# Patient Record
Sex: Male | Born: 1940 | Race: White | Marital: Married | Smoking: Never smoker
Health system: Southern US, Community
[De-identification: ages and names within clinical notes are randomized; demographics above are authoritative.]

## PROBLEM LIST (undated history)

## (undated) DIAGNOSIS — K4090 Unilateral inguinal hernia, without obstruction or gangrene, not specified as recurrent: Secondary | ICD-10-CM

## (undated) DIAGNOSIS — E7800 Pure hypercholesterolemia, unspecified: Secondary | ICD-10-CM

## (undated) DIAGNOSIS — I10 Essential (primary) hypertension: Secondary | ICD-10-CM

## (undated) DIAGNOSIS — D4959 Neoplasm of unspecified behavior of other genitourinary organ: Secondary | ICD-10-CM

## (undated) DIAGNOSIS — I639 Cerebral infarction, unspecified: Secondary | ICD-10-CM

## (undated) HISTORY — DX: Essential (primary) hypertension: I10

## (undated) HISTORY — DX: Pure hypercholesterolemia, unspecified: E78.00

---
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
## 2011-10-10 ENCOUNTER — Other Ambulatory Visit: Payer: Self-pay | Admitting: Urology

## 2011-10-11 ENCOUNTER — Encounter (HOSPITAL_BASED_OUTPATIENT_CLINIC_OR_DEPARTMENT_OTHER): Payer: Self-pay | Admitting: *Deleted

## 2011-10-11 NOTE — H&P (Signed)
History of Present Illness               F/u microscopic hematuria referred by Dr. Judithann Sauger May 2010. He noticed some blood spots on his shorts twice in April 2010 and on his routine physical and microscopic hematuria. He had a cystic area the lower pole of his right kidney that was stable on renal US in 2010 and 2011. Cytology was negative May 2010. He did not have cystoscopy. He had microscopic hematuria saw Dr. Patsi Sears in the early 90s had cystoscopy and x-ray at [facility] radiology which were both "negative".  He is a nonsmoker. His father smoked. He has had no chemo or XRT. No dye solvent exposure.   Renal US Jun 2013 - comparison [place] 2010 and 2011, see technician sheet for details, I reviewed all the images, findings: The right kidney contained a 1.46 cm upper pole central cyst and a right lower pole 1.5 cm exophytic cyst both of these are stable. There was no other mass or hydronephrosis. Left kidney appeared normal without mass or hydronephrosis. The bladder appeared normal no ureters were seen. Post void 50 mL. The spleen was 13 cm and appeared normal.   He's also had a slow stream frequent urination and postvoid dripping that was markedly better with he was put on Flomax. This decreased his nocturia from 3-1 time per night. His PSA was 1.22 July 2008. PSA was 0.98 June 2012. He stopped tamsulosin in 2012. He has had some improvements in his voiding. He attributes this to starting a different work-out routine. He has had no gross hematuria or dysuria. No bothersome LUTS.  May 2013 PSA 1.5.   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 He has one son and 2 granddaughters. He's had no surgery and no other serious medical issues.   He is having a bit more trouble with erections and decreased libido since his wife has gained weight. PSA was 0.98 June 2012.   Interval Hx He returns to review his CT and for cystoscopy to complete his hematuria  evaluation. CT Hematuria June 2013 showed small renal and peripelvic cysts but was otw normal.    Past Medical History Problems  1. History of  Asthma 493.90 2. History of  Hypercholesterolemia 272.0  Current Meds 1. Aspirin 81 MG Oral Tablet; Therapy: (Recorded:04May2010) to 2. Pravastatin Sodium 20 MG Oral Tablet; Therapy: 03May2013 to 3. Vitamin D TABS; Therapy: (Recorded:04May2010) to 4. Zyrtec 10 MG TABS; Therapy: (Recorded:04May2010) to  Allergies Medication  1. No Known Drug Allergies  Family History Problems  1. Family history of  Death In The Family Father stroke 2. Family history of  Death In The Family Mother heart failure  Social History Problems  1. Alcohol Use one glass of wine 3 days out of the week 2. Caffeine Use 3 per day of coffee 3. Marital History - Currently Married 4. Never A Smoker 5. Occupation: retired from [employer] T Denied  6. History of  Tobacco Use  Review of Systems Constitutional, skin, otolaryngeal, cardiovascular and pulmonary system(s) were reviewed and pertinent findings if present are noted.    Physical Exam Constitutional: Well nourished and well developed . No acute distress.  ENT:. The ears and nose are normal in appearance.  Neck: The appearance of the neck is normal and no neck mass is present.  Pulmonary: No respiratory distress and normal respiratory rhythm and effort.  Cardiovascular: Heart rate and rhythm  are normal . No peripheral edema.  Abdomen: The abdomen is soft and nontender. No masses are palpated. No CVA tenderness. No hernias are palpable. No hepatosplenomegaly noted.  Neuro/Psych:. Mood and affect are appropriate.    Results/Data  Urine [Data Includes: Last 1 Day]   19Jun2013  COLOR YELLOW   APPEARANCE CLEAR   SPECIFIC GRAVITY 1.020   pH 6.5   GLUCOSE NEG mg/dL  BILIRUBIN NEG   KETONE NEG mg/dL  BLOOD LARGE   PROTEIN NEG mg/dL  UROBILINOGEN 0.2 mg/dL  NITRITE NEG   LEUKOCYTE ESTERASE NEG   SQUAMOUS  EPITHELIAL/HPF RARE   WBC 0-3 WBC/hpf  RBC 11-20 RBC/hpf  BACTERIA RARE   CRYSTALS NONE SEEN   CASTS NONE SEEN    09 Oct 2011 3:31 PM   UA With REFLEX       COLOR YELLOW       APPEARANCE CLEAR       SPECIFIC GRAVITY 1.020       pH 6.5       GLUCOSE NEG       BILIRUBIN NEG       KETONE NEG       BLOOD LARGE       PROTEIN NEG       UROBILINOGEN 0.2       NITRITE NEG       LEUKOCYTE ESTERASE NEG       SQUAMOUS EPITHELIAL/HPF RARE       WBC 0-3       CRYSTALS NONE SEEN       CASTS NONE SEEN       RBC 11-20       BACTERIA RARE    Procedure  Procedure: Cystoscopy   Indication: Hematuria.  Informed Consent: Risks, benefits, and potential adverse events were discussed and informed consent was obtained from the patient.  Prep: The patient was prepped with betadine.  Antibiotic prophylaxis: Ciprofloxacin.  Procedure Note:  Urethral meatus:. No abnormalities.  Anterior urethra: No abnormalities.  Prostatic urethra: No abnormalities . The lateral prostatic lobes were enlarged.  There is a neoplastic growth hanging off the right lateral lobe flowing back and forth in the prostatic urethra.  Bladder: Visulization was clear. The ureteral orifices were in the normal anatomic position bilaterally and had clear efflux of urine. A systematic survey of the bladder demonstrated no bladder tumors or stones. The mucosa was smooth without abnormalities. The patient tolerated the procedure well.  Complications: None.    Assessment Assessed  1. 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 Follow-up Schedule Surgery Office  Follow-up  Requested for: 19Jun2013  UA With REFLEX  Status: Resulted - Requires Verification  Done: 01Jan0001 12:00AM Ordered Today; For: Health  Maintenance (V70.0); Ordered By: Jerilee Field  Due: 21Jun2013 Marked Important; Last Updated By: Blinda Leatherwood   Discussion/Summary        We discussed the nature, R/B/A of TURP using the TURP handout. I dont think he needs a complete TURP, just removal and biopsy of this neoplastic growth. All questions answered. He elects to proceed.

## 2011-10-14 ENCOUNTER — Encounter (HOSPITAL_BASED_OUTPATIENT_CLINIC_OR_DEPARTMENT_OTHER): Payer: Self-pay | Admitting: *Deleted

## 2011-10-14 NOTE — Progress Notes (Signed)
Pt instructed npo p mn tonight.  To [facility] 6/25 @ 0615.  Needs hgb, ekg on arrival.

## 2011-10-15 ENCOUNTER — Encounter (HOSPITAL_BASED_OUTPATIENT_CLINIC_OR_DEPARTMENT_OTHER): Payer: Self-pay | Admitting: *Deleted

## 2011-10-15 ENCOUNTER — Encounter (HOSPITAL_BASED_OUTPATIENT_CLINIC_OR_DEPARTMENT_OTHER): Payer: Self-pay | Admitting: Anesthesiology

## 2011-10-15 ENCOUNTER — Encounter (HOSPITAL_BASED_OUTPATIENT_CLINIC_OR_DEPARTMENT_OTHER)
Admission: RE | Disposition: A | Discharge status: Home / Self Care | Payer: Self-pay | Source: Ambulatory Visit | Attending: Urology

## 2011-10-15 ENCOUNTER — Ambulatory Visit (HOSPITAL_BASED_OUTPATIENT_CLINIC_OR_DEPARTMENT_OTHER): Payer: Medicare Other | Admitting: Anesthesiology

## 2011-10-15 ENCOUNTER — Ambulatory Visit (HOSPITAL_BASED_OUTPATIENT_CLINIC_OR_DEPARTMENT_OTHER)
Admission: RE | Admit: 2011-10-15 | Discharge: 2011-10-15 | Disposition: A | Discharge status: Home / Self Care | Payer: Medicare Other | Source: Ambulatory Visit | Attending: Urology | Admitting: Urology

## 2011-10-15 ENCOUNTER — Other Ambulatory Visit: Payer: Self-pay

## 2011-10-15 DIAGNOSIS — J45909 Unspecified asthma, uncomplicated: Secondary | ICD-10-CM | POA: Insufficient documentation

## 2011-10-15 DIAGNOSIS — Z7982 Long term (current) use of aspirin: Secondary | ICD-10-CM | POA: Insufficient documentation

## 2011-10-15 DIAGNOSIS — R3129 Other microscopic hematuria: Secondary | ICD-10-CM | POA: Insufficient documentation

## 2011-10-15 DIAGNOSIS — N400 Enlarged prostate without lower urinary tract symptoms: Secondary | ICD-10-CM | POA: Insufficient documentation

## 2011-10-15 DIAGNOSIS — Z79899 Other long term (current) drug therapy: Secondary | ICD-10-CM | POA: Insufficient documentation

## 2011-10-15 DIAGNOSIS — D400 Neoplasm of uncertain behavior of prostate: Secondary | ICD-10-CM | POA: Insufficient documentation

## 2011-10-15 DIAGNOSIS — E7800 Pure hypercholesterolemia, unspecified: Secondary | ICD-10-CM | POA: Insufficient documentation

## 2011-10-15 HISTORY — PX: TRANSURETHRAL RESECTION OF PROSTATE: SHX73

## 2011-10-15 HISTORY — DX: Neoplasm of unspecified behavior of other genitourinary organ: D49.59

## 2011-10-15 LAB — POCT HEMOGLOBIN-HEMACUE: Hemoglobin: 17.8 g/dL — ABNORMAL HIGH (ref 13.0–17.0)

## 2011-10-15 SURGERY — TURP (TRANSURETHRAL RESECTION OF PROSTATE)
Anesthesia: General

## 2011-10-15 MED ORDER — PROMETHAZINE HCL 25 MG/ML IJ SOLN: 6.2500 mg | INTRAMUSCULAR | Status: DC | PRN

## 2011-10-15 MED ORDER — CEFAZOLIN SODIUM 1-5 GM-% IV SOLN
1.0000 g | INTRAVENOUS | Status: AC
  Administered 2011-10-15: 1 g via INTRAVENOUS

## 2011-10-15 MED ORDER — FENTANYL CITRATE 0.05 MG/ML IJ SOLN
INTRAMUSCULAR | Status: DC | PRN
  Administered 2011-10-15: 50 ug via INTRAVENOUS
  Administered 2011-10-15: 25 ug via INTRAVENOUS

## 2011-10-15 MED ORDER — CIPROFLOXACIN HCL 250 MG PO TABS: 500.0000 mg | ORAL_TABLET | Freq: Two times a day (BID) | ORAL | Status: AC

## 2011-10-15 MED ORDER — LIDOCAINE HCL (CARDIAC) 20 MG/ML IV SOLN
INTRAVENOUS | Status: DC | PRN
  Administered 2011-10-15: 75 mg via INTRAVENOUS

## 2011-10-15 MED ORDER — ONDANSETRON HCL 4 MG/2ML IJ SOLN
INTRAMUSCULAR | Status: DC | PRN
  Administered 2011-10-15: 4 mg via INTRAVENOUS

## 2011-10-15 MED ORDER — OXYCODONE-ACETAMINOPHEN 5-325 MG PO TABS: 1.0000 | ORAL_TABLET | Freq: Four times a day (QID) | ORAL | Status: DC | PRN

## 2011-10-15 MED ORDER — DEXAMETHASONE SODIUM PHOSPHATE 4 MG/ML IJ SOLN
INTRAMUSCULAR | Status: DC | PRN
  Administered 2011-10-15: 10 mg via INTRAVENOUS

## 2011-10-15 MED ORDER — MEPERIDINE HCL 25 MG/ML IJ SOLN: 6.2500 mg | INTRAMUSCULAR | Status: DC | PRN

## 2011-10-15 MED ORDER — ASPIRIN 81 MG PO TABS: 81.0000 mg | ORAL_TABLET | Freq: Every day | ORAL | Status: DC

## 2011-10-15 MED ORDER — FENTANYL CITRATE 0.05 MG/ML IJ SOLN: 25.0000 ug | INTRAMUSCULAR | Status: DC | PRN

## 2011-10-15 MED ORDER — LACTATED RINGERS IV SOLN: INTRAVENOUS | Status: DC

## 2011-10-15 MED ORDER — SODIUM CHLORIDE 0.9 % IR SOLN
Status: DC | PRN
  Administered 2011-10-15: 3000 mL

## 2011-10-15 MED ORDER — OXYCODONE-ACETAMINOPHEN 5-325 MG PO TABS
1.0000 | ORAL_TABLET | Freq: Four times a day (QID) | ORAL | Status: AC | PRN
  Administered 2011-10-15: 1 via ORAL

## 2011-10-15 MED ORDER — PROPOFOL 10 MG/ML IV EMUL
INTRAVENOUS | Status: DC | PRN
  Administered 2011-10-15: 200 mg via INTRAVENOUS

## 2011-10-15 MED ORDER — OXYCODONE-ACETAMINOPHEN 5-325 MG PO TABS: 1.0000 | ORAL_TABLET | Freq: Four times a day (QID) | ORAL | Status: AC | PRN

## 2011-10-15 MED ORDER — BELLADONNA ALKALOIDS-OPIUM 16.2-60 MG RE SUPP
RECTAL | Status: DC | PRN
  Administered 2011-10-15: 1 via RECTAL

## 2011-10-15 MED ORDER — LACTATED RINGERS IV SOLN
INTRAVENOUS | Status: DC
  Administered 2011-10-15 (×2): via INTRAVENOUS

## 2011-10-15 SURGICAL SUPPLY — 32 items
BAG DRAIN URO-CYSTO SKYTR STRL (DRAIN) ×2 IMPLANT
BAG URINE DRAINAGE (UROLOGICAL SUPPLIES) ×2 IMPLANT
BAG URINE LEG 19OZ MD ST LTX (BAG) IMPLANT
BLADE SURG 15 STRL LF DISP TIS (BLADE) IMPLANT
BLADE SURG 15 STRL SS (BLADE)
CANISTER SUCT LVC 12 LTR MEDI- (MISCELLANEOUS) IMPLANT
CATH FOLEY 2WAY SLVR  5CC 18FR (CATHETERS) ×1
CATH FOLEY 2WAY SLVR 5CC 18FR (CATHETERS) ×1 IMPLANT
CATH FOLEY 3WAY 30CC 22F (CATHETERS) IMPLANT
CATH HEMA 3WAY 30CC 24FR COUDE (CATHETERS) IMPLANT
CATH HEMA 3WAY 30CC 24FR RND (CATHETERS) IMPLANT
CLOTH BEACON ORANGE TIMEOUT ST (SAFETY) ×2 IMPLANT
DRAPE CAMERA CLOSED 9X96 (DRAPES) ×2 IMPLANT
ELECT BUTTON BIOP 24F 90D PLAS (MISCELLANEOUS) IMPLANT
ELECT LOOP HF 26F 30D .35MM (CUTTING LOOP) IMPLANT
ELECT LOOP MED HF 24F 12D CBL (CLIP) ×2 IMPLANT
ELECT REM PT RETURN 9FT ADLT (ELECTROSURGICAL)
ELECTRODE REM PT RTRN 9FT ADLT (ELECTROSURGICAL) IMPLANT
EVACUATOR MICROVAS BLADDER (UROLOGICAL SUPPLIES) IMPLANT
GLOVE BIO SURGEON STRL SZ7.5 (GLOVE) ×2 IMPLANT
GLOVE BIOGEL M STER SZ 6 (GLOVE) ×2 IMPLANT
GLOVE INDICATOR 6.5 STRL GRN (GLOVE) ×2 IMPLANT
GOWN STRL REIN XL XLG (GOWN DISPOSABLE) ×2 IMPLANT
HOLDER FOLEY CATH W/STRAP (MISCELLANEOUS) ×2 IMPLANT
KIT ASPIRATION TUBING (SET/KITS/TRAYS/PACK) IMPLANT
KIT SUPRAPUBIC CATH (MISCELLANEOUS) IMPLANT
LOOP CUTTING 24FR OLYMPUS (CUTTING LOOP) IMPLANT
PACK CYSTOSCOPY (CUSTOM PROCEDURE TRAY) ×2 IMPLANT
PLUG CATH AND CAP STER (CATHETERS) IMPLANT
SET ASPIRATION TUBING (TUBING) IMPLANT
SUT ETHILON 3 0 PS 1 (SUTURE) IMPLANT
SYR 30ML LL (SYRINGE) ×4 IMPLANT

## 2011-10-15 NOTE — Anesthesia Postprocedure Evaluation (Signed)
  Anesthesia Post-op Note  Patient: Jeremy Church  Procedure(s) Performed: Procedure(s) (LRB): TRANSURETHRAL RESECTION OF THE PROSTATE (TURP) (N/A)  Patient Location: PACU  Anesthesia Type: General  Level of Consciousness: awake and alert   Airway and Oxygen Therapy: Patient Spontanous Breathing  Post-op Pain: mild  Post-op Assessment: Post-op Vital signs reviewed, Patient's Cardiovascular Status Stable, Respiratory Function Stable, Patent Airway and No signs of Nausea or vomiting  Post-op Vital Signs: stable  Complications: No apparent anesthesia complications

## 2011-10-15 NOTE — Interval H&P Note (Signed)
History and Physical Interval Note:  10/15/2011 7:33 AM  Jeremy Church  has presented today for surgery, with the diagnosis of PROSTATE NEOPLASM  The various methods of treatment have been discussed with the patient and his wife. After consideration of risks, benefits and other options for treatment, the patient has consented to  Procedure(s) (LRB): TRANSURETHRAL RESECTION OF THE PROSTATE (TURP) (N/A) as a surgical intervention .  [name] history has been reviewed, patient examined, no change in status, stable for surgery.  I have reviewed the patients' chart and labs.  Questions were answered to the patient's satisfaction.     [name]

## 2011-10-15 NOTE — Discharge Instructions (Signed)
Transurethral Resection of the Prostate Care After Refer to this sheet in the next few weeks. These discharge instructions provide you with general information on caring for yourself after you leave the hospital. Your caregiver may also give you specific instructions. Your treatment has been planned according to the most current medical practices available, but unavoidable complications sometimes occur. If you have any problems or questions after discharge, please call your caregiver. HOME CARE INSTRUCTIONS  Medications  You will receive medicine for pain management. As your level of discomfort decreases, adjustments in your pain medicines may be made.   Since it is important that you do deep breathing and coughing exercises and increase your activity, it may be helpful to take pain medicines prior to these activities. Take all medicines as directed.   You may be given a medicine (antibiotic) to kill germs following surgery. Finish all medicines. Let your caregiver know if you have any side effects or problems from the medicine.  Hygiene  You can take a shower after surgery.   You should not take a bath while you still have the urethral catheter.  Activity  You will be encouraged to get out of bed as much as possible and increase your activity level as tolerated.   Spend the first week in and around your home. For 5 days, avoid the following:   Straining.   Running.   Strenuous work.   Walks longer than a few blocks.   Riding for extended periods.   Sexual relations.   Do not lift heavy objects (more than 5 pounds/2.25 kilograms) for at least 2 weeks. When lifting, use your arms instead of your abdominal muscles.   You will be encouraged to walk as tolerated. Do not exert yourself. Increase your activity level slowly. Remember that it is important to keep moving after an operation of any type. This cuts down on the possibility of developing blood clots.   Your caregiver will  tell you when you can resume driving and light housework. Discuss this at your first office visit after discharge.  Diet  No special diet is ordered after a TURP. However, if you are on a special diet for another medical problem, it should be continued.   Normal fluid intake is usually recommended.  Bladder Function  For the first 10 days, empty the bladder whenever you feel a definite desire. Do not try to hold the urine for long periods of time.   Urinating once or twice a night even after you are healed is not uncommon.   You may see some recurrence of blood in the urine after discharge from the hospital. If this occurs, force fluids again as you did in the hospital and reduce your activity.  Bowel Function  You may experience some constipation after surgery. This can be minimized by increasing fluids and fiber in your diet. Drink enough water and fluids to keep your urine clear or pale yellow.   A stool softener may be prescribed for use at home. Do not strain to move your bowels.   If you are requiring increased pain medicine, it is important that you take stool softeners to prevent constipation. This will help to promote proper healing by reducing the need to strain to move your bowels.  Sexual Activity  Semen movement in the opposite direction and into the bladder (retrograde ejaculation) may occur. Since the semen passes into the bladder, cloudy urine can occur the first time you urinate after intercourse. Or, you may not  have an ejaculation during erection. Ask your caregiver when you can resume sexual activity. Retrograde ejaculation and reduced semen discharge should not reduce one's pleasure of intercourse.  Postoperative Visit  Arrange the date and time of your after surgery visit with your caregiver.  Return to Work  After your recovery is complete, you will be able to return to work and resume all activities. Your caregiver will inform you when you can return to work.    Foley Catheter Care A soft, flexible tube (Foley catheter) has been placed in your bladder to drain urine and fluid. Follow these instructions: Taking Care of the Catheter  Keep the area where the catheter leaves your body clean.   Attach the catheter to the leg so there is no tension on the catheter.   Keep the drainage bag below the level of the bladder, but keep it OFF the floor.   Do not take long soaking baths. Your caregiver will give instructions about showering.   Wash your hands before touching ANYTHING related to the catheter or bag.   Using mild soap and warm water on a washcloth:   Clean the area closest to the catheter insertion site using a circular motion around the catheter.   Clean the catheter itself by wiping AWAY from the insertion site for several inches down the tube.   NEVER wipe upward as this could sweep bacteria up into the urethra (tube in your body that normally drains the bladder) and cause infection.   Place a small amount of sterile lubricant at the tip of the penis where the catheter is entering.  Taking Care of the Drainage Bags  Two drainage bags will be taken home: a large overnight drainage bag, and a smaller leg bag which fits underneath clothing.   It is okay to wear the overnight bag at any time, but NEVER wear the smaller leg bag at night.   Keep the drainage bag well below the level of your bladder. This prevents backflow of urine into the bladder and allows the urine to drain freely.   Anchor the tubing to your leg to prevent pulling or tension on the catheter. Use tape or a leg strap provided by the hospital.   Empty the drainage bag when it is 1/2 to 3/4 full. Wash your hands before and after touching the bag.   Periodically check the tubing for kinks to make sure there is no pressure on the tubing which could restrict the flow of urine.  Changing the Drainage Bags  Cleanse both ends of the clean bag with alcohol before changing.    Pinch off the rubber catheter to avoid urine spillage during the disconnection.   Disconnect the dirty bag and connect the clean one.   Empty the dirty bag carefully to avoid a urine spill.   Attach the new bag to the leg with tape or a leg strap.  Cleaning the Drainage Bags  Whenever a drainage bag is disconnected, it must be cleaned quickly so it is ready for the next use.   Wash the bag in warm, soapy water.   Rinse the bag thoroughly with warm water.   Soak the bag for 30 minutes in a solution of white vinegar and water (1 cup vinegar to 1 quart warm water).   Rinse with warm water.  SEEK MEDICAL CARE IF:   You have chills or night sweats.   You are leaking around your catheter or have problems with your catheter. It is  not uncommon to have sporadic leakage around your catheter as a result of bladder spasms. If the leakage stops, there is not much need for concern. If you are uncertain, call your caregiver.   You develop side effects that you think are coming from your medicines.  SEEK IMMEDIATE MEDICAL CARE IF:   You are suddenly unable to urinate. Check to see if there are any kinks in the drainage tubing that may cause this. If you cannot find any kinks, call your caregiver immediately. This is an emergency.   You develop shortness of breath or chest pains.   Bleeding persists or clots develop in your urine.   You have a fever.   You develop pain in your back or over your lower belly (abdomen).   You develop pain or swelling in your legs.   Any problems you are having get worse rather than better.  MAKE SURE YOU:   Understand these instructions.   Will watch your condition.   Will get help right away if you are not doing well or get worse.  Document Released: 04/08/2005 Document Revised: 03/28/2011 Document Reviewed: 11/30/2008 [facility] Patient Information 2012 [place], [place]. Post Anesthesia Home Care Instructions  Activity: Get plenty of rest for the  remainder of the day. A responsible adult should stay with you for 24 hours following the procedure.  For the next 24 hours, DO NOT: -Drive a car -Advertising copywriter -Drink alcoholic beverages -Take any medication unless instructed by your physician -Make any legal decisions or sign important papers.  Meals: Start with liquid foods such as gelatin or soup. Progress to regular foods as tolerated. Avoid greasy, spicy, heavy foods. If nausea and/or vomiting occur, drink only clear liquids until the nausea and/or vomiting subsides. Call your physician if vomiting continues.  Special Instructions/Symptoms: Your throat may feel dry or sore from the anesthesia or the breathing tube placed in your throat during surgery. If this causes discomfort, gargle with warm salt water. The discomfort should disappear within 24 hours.

## 2011-10-15 NOTE — Transfer of Care (Signed)
Immediate Anesthesia Transfer of Care Note  Patient: Jeremy Church  Procedure(s) Performed: Procedure(s) (LRB): TRANSURETHRAL RESECTION OF THE PROSTATE (TURP) (N/A)  Patient Location: Patient transported to PACU with oxygen via face mask at 4 Liters / Min  Anesthesia Type: General  Level of Consciousness: awake and alert   Airway & Oxygen Therapy: Patient Spontanous Breathing and Patient connected to face mask oxygen  Post-op Assessment: Report given to PACU RN and Post -op Vital signs reviewed and stable  Post vital signs: Reviewed and stable  Dentition: Teeth and oropharynx remain in pre-op condition  Complications: No apparent anesthesia complications

## 2011-10-15 NOTE — Brief Op Note (Signed)
10/15/2011  8:07 AM  PATIENT:  Jeremy Church  71 y.o. male  PRE-OPERATIVE DIAGNOSIS:  PROSTATE NEOPLASM, microhematuria  POST-OPERATIVE DIAGNOSIS:  Prostate Neoplasm  PROCEDURE:  Procedure(s) (LRB): TRANSURETHRAL RESECTION OF THE PROSTATE (TURP) Limited  SURGEON:  Surgeon(s) and Role:    * [name], MD - Primary  ANESTHESIA:   general  EBL: minimal  DRAINS: 18 fr foley  LOCAL MEDICATIONS USED:  OTHER B&O supp  SPECIMEN:  Limited resection prostate  DISPOSITION OF SPECIMEN:  PATHOLOGY  COUNTS:  YES  PLAN OF CARE: Discharge to home after PACU  PATIENT DISPOSITION:  PACU - hemodynamically stable.   Delay start of Pharmacological VTE agent (>24hrs) due to surgical blood loss or risk of bleeding: not applicable  [id number]

## 2011-10-15 NOTE — Anesthesia Procedure Notes (Signed)
Procedure Name: LMA Insertion Date/Time: 10/15/2011 7:38 AM Performed by: Fran Lowes Pre-anesthesia Checklist: Patient identified, Emergency Drugs available, Suction available and Patient being monitored Patient Re-evaluated:Patient Re-evaluated prior to inductionOxygen Delivery Method: Circle System Utilized Preoxygenation: Pre-oxygenation with 100% oxygen Intubation Type: IV induction Ventilation: Mask ventilation without difficulty LMA: LMA inserted LMA Size: 4.0 Number of attempts: 1 Airway Equipment and Method: bite block Placement Confirmation: positive ETCO2 Tube secured with: Tape Dental Injury: Teeth and Oropharynx as per pre-operative assessment

## 2011-10-15 NOTE — Anesthesia Preprocedure Evaluation (Signed)
Anesthesia Evaluation  Patient identified by MRN, date of birth, ID band Patient awake    Reviewed: Allergy & Precautions, H&P , NPO status , Patient's Chart, lab work & pertinent test results  Airway Mallampati: III TM Distance: >3 FB Neck ROM: Full    Dental No notable dental hx. (+) Dental Advisory Given and Teeth Intact   Pulmonary neg pulmonary ROS,  breath sounds clear to auscultation  Pulmonary exam normal       Cardiovascular negative cardio ROS  Rhythm:Regular Rate:Normal     Neuro/Psych negative neurological ROS  negative psych ROS   GI/Hepatic negative GI ROS, Neg liver ROS,   Endo/Other  negative endocrine ROS  Renal/GU negative Renal ROS  negative genitourinary   Musculoskeletal negative musculoskeletal ROS (+)   Abdominal   Peds negative pediatric ROS (+)  Hematology negative hematology ROS (+)   Anesthesia Other Findings   Reproductive/Obstetrics negative OB ROS                           Anesthesia Physical Anesthesia Plan  ASA: I  Anesthesia Plan: General   Post-op Pain Management:    Induction: Intravenous  Airway Management Planned: LMA  Additional Equipment:   Intra-op Plan:   Post-operative Plan:   Informed Consent: I have reviewed the patients History and Physical, chart, labs and discussed the procedure including the risks, benefits and alternatives for the proposed anesthesia with the patient or authorized representative who has indicated his/her understanding and acceptance.   Dental advisory given  Plan Discussed with: CRNA  Anesthesia Plan Comments:         Anesthesia Quick Evaluation

## 2011-10-16 ENCOUNTER — Encounter (HOSPITAL_BASED_OUTPATIENT_CLINIC_OR_DEPARTMENT_OTHER): Payer: Self-pay | Admitting: Urology

## 2011-10-16 NOTE — Op Note (Signed)
NAME:  Jeremy Church, Jeremy Church NO.:  [id number]  MEDICAL RECORD NO.:  [id number]  LOCATION:  PERIOP                        FACILITY:  [facility]  PHYSICIAN:  Jerilee Field, MD   DATE OF BIRTH:  [birth year]  DATE OF PROCEDURE:  10/15/2011 DATE OF DISCHARGE:                              OPERATIVE REPORT   PREOPERATIVE DIAGNOSES: 1. Prostate neoplasm. 2. Microhematuria.  POSTOPERATIVE DIAGNOSES: 1. Prostate neoplasm. 2. Microhematuria.  PROCEDURE:  Transurethral resection of prostate.  Limited for biopsy.  SURGEON:  Jerilee Field, MD  TYPE OF ANESTHESIA:  General.  FINDINGS:  On cystoscopy, the anterior urethra was normal.  In the prostatic urethra, there was a papillary tongue-like neoplasm emanating from the right lateral prostate lobe filling the prostatic urethra. This mass was quite friable.  Photos were taken.  Cystoscopy was performed of the bladder.  The bladder was noted to be normal without lesion, tumor, foreign body, or stone.  There was mild trabeculation. The mass filling the prostatic urethra was quite friable and actually tore and bled easily with passage of the rigid cystoscope.  It was connected by a stalk to the right lateral lobe of the prostate just proximal to the veru.  Photos were taken.  Trigone and ureteral orifices were normal and in their normal anatomic position.  DESCRIPTION OF PROCEDURE:  After consent was obtained, the patient was brought to the operating room.  A time-out was performed to confirm the patient and procedure.  After adequate anesthesia, he was placed in lithotomy position and prepped and draped in usual fashion.  Rigid cystoscope was passed per urethra and the bladder examined in its entirety with previous findings.  The cystoscope was removed and the 26-French resectoscope was passed using a loop in the bipolar.  The stalk from the mass was resected from the right lateral lobe.  It was superficial and mucosal.  The  underlying tissue appeared as normal BPH.  The mass washed up into the bladder.  It was collected and sent for pathologic analysis.  The resection site was then fulgurated.  Adequate hemostasis was obtained.  The prostate itself had mild bilobar BPH with partial obstruction.  The scope was removed and an 18-French Foley was placed, draining clear urine.  The patient had been given Beano at the beginning of the case.  He was then awakened and taken to recovery room in stable condition.  ESTIMATED BLOOD LOSS:  Minimal.  COMPLICATIONS:  None.  DRAINS:  A 18-French Foley.  SPECIMENS:  Limited resection, prostate to Pathology.  DISPOSITION:  The patient stable to PACU.  Plan to discontinue the Foley and discharge the patient home once he is awake and stable.          ______________________________ Jerilee Field, MD     ME/MEDQ  D:  10/15/2011  T:  10/15/2011  Job:  [id number]

## 2012-04-22 DIAGNOSIS — I639 Cerebral infarction, unspecified: Secondary | ICD-10-CM

## 2012-04-22 HISTORY — DX: Cerebral infarction, unspecified: I63.9

## 2015-08-24 ENCOUNTER — Ambulatory Visit (INDEPENDENT_AMBULATORY_CARE_PROVIDER_SITE_OTHER): Payer: Medicare Other | Admitting: Neurology

## 2015-08-24 ENCOUNTER — Encounter: Payer: Self-pay | Admitting: Neurology

## 2015-08-24 VITALS — BP 156/79 | HR 65 | Ht 67.0 in | Wt 149.6 lb

## 2015-08-24 DIAGNOSIS — G8190 Hemiplegia, unspecified affecting unspecified side: Secondary | ICD-10-CM | POA: Diagnosis not present

## 2015-08-24 DIAGNOSIS — R4789 Other speech disturbances: Secondary | ICD-10-CM

## 2015-08-24 DIAGNOSIS — R29818 Other symptoms and signs involving the nervous system: Secondary | ICD-10-CM

## 2015-08-24 DIAGNOSIS — I10 Essential (primary) hypertension: Secondary | ICD-10-CM

## 2015-08-24 DIAGNOSIS — R202 Paresthesia of skin: Secondary | ICD-10-CM | POA: Diagnosis not present

## 2015-08-24 DIAGNOSIS — M6289 Other specified disorders of muscle: Secondary | ICD-10-CM

## 2015-08-24 DIAGNOSIS — R200 Anesthesia of skin: Secondary | ICD-10-CM

## 2015-08-24 DIAGNOSIS — R413 Other amnesia: Secondary | ICD-10-CM

## 2015-08-24 DIAGNOSIS — E785 Hyperlipidemia, unspecified: Secondary | ICD-10-CM | POA: Diagnosis not present

## 2015-08-24 DIAGNOSIS — M6281 Muscle weakness (generalized): Secondary | ICD-10-CM

## 2015-08-24 NOTE — Patient Instructions (Signed)
Remember to drink plenty of fluid, eat healthy meals and do not skip any meals. Try to eat protein with a every meal and eat a healthy snack such as fruit or nuts in between meals. Try to keep a regular sleep-wake schedule and try to exercise daily, particularly in the form of walking, 20-30 minutes a day, if you can.   As far as your medications are concerned, I would like to suggest: Can increase to ASA 325 in the meantime  As far as diagnostic testing: MRI of the brain, [place] imaging 3T MRI  I would like to see you back in 3 months, sooner if we need to. Please call us with any interim questions, concerns, problems, updates or refill requests.   Our phone number is [phone]. We also have an after hours call service for urgent matters and there is a physician on-call for urgent questions. For any emergencies you know to call 911 or go to the nearest emergency room

## 2015-08-24 NOTE — Progress Notes (Signed)
[facility]    Provider:  Dr Jaynee Eagles Referring Provider: Hulan Fess, MD Primary Care Physician:  Gennette Pac, MD  CC:  Right sided symptoms, weakness and sensory changes  HPI:  Jeremy Church is a left-handed 75 y.o. male here as a referral from Dr. Rex Kras for right-sided symptoms. Past medical history of mixed hyperlipidemia, elevated fasting glucose, osteopenia, BPH, pseudothrombocytopenia, essential hypertension. In February he broke out into hot sweats and the next day he was very unsteady, lightheaded, not feeling well. Here with his wife who also provides information. In March he also noticed that he couldn't feel hot water in his right hand, significant difference between the right and left hand. Now he has difficulty with dressing,  feels sensory changes in the right side of the body, sensory difference between the right thorax and left thorax. Denies numbness, fine motor difficulties, but he does describe weakness in the right leg. His right leg has given out, he fell. No joint problem strictly strength. No facial sensory changes or weakness, no aphasia, no dysphagia, he has word-finding difficulty. The whole right hand appears to be affected with the sensory changes. No symptoms of carpal tunnel syndrome. Symptoms are not worsening. They are however interfering with his daily activities of living including dressing and walking. He has a history of statin intolerance with elevated CPK and muscle aches. Denies neck pain or low back pain.  Reviewed notes, labs and imaging from outside physicians, which showed: Reviewed notes from [facility] physicians. Notes from appointments: stating that patient came in with complaints of lightheadedness and sensory changes. In February he had 3 episodes where he felt generally lightheaded and unsteady on his feet. No orthostatic changes were associated. No vertigo. Didn't actually fall but felt unsteady and had to hold onto things. After 3  days a symptoms resolved. After that he had temperature changes in the right hand. Patient is left-handed. 2 weeks prior to this appointment he was walking up some steps and felt like his right leg was going to give out for seconds and he almost fell. Examination was nonfocal. Labs ordered include B12, CBC with differential, CMP, TSH, lipid panel. Depression screening and cognitive evaluation was normal.   LDL 124 HDL 44. Elevated CPK to 60 on subsequent lab testing 171 in August 2014 after stopping Crestor.  Review of Systems: Patient complains of symptoms per HPI as well as the following symptoms: Aching muscles, no chest pain, no shortness of breath. Pertinent negatives per HPI. All others negative.   Social History   Social History  . Marital Status: Married    Spouse Name: Bethena Roys   . Number of Children: 1  . Years of Education: 14   Occupational History  . Retired    Social History Main Topics  . Smoking status: Never Smoker   . Smokeless tobacco: Not on file  . Alcohol Use: 2.4 oz/week    4 Glasses of wine per week     Comment: 1 glass wine/day  . Drug Use: No  . Sexual Activity: Not on file   Other Topics Concern  . Not on file   Social History Narrative   Lives with wife   Caffeine use: Coffee- 3-4 cups per day   No soda   Tea- rare     Family History  Problem Relation Age of Onset  . Hypertension Mother   . Hypertension Father   . Stroke Father     Past Medical History  Diagnosis Date  .  Prostate neoplasm   . Hypertension   . High cholesterol     Past Surgical History  Procedure Laterality Date  . Transurethral resection of prostate  10/15/2011    Procedure: TRANSURETHRAL RESECTION OF THE PROSTATE (TURP);  Surgeon: Fredricka Bonine, MD;  Location: [facility];  Service: Urology;  Laterality: N/A;  CYSTO AND GYRUS NEEDED    Current Outpatient Prescriptions  Medication Sig Dispense Refill  . aspirin 81 MG tablet Take 1 tablet (81  mg total) by mouth daily. 30 tablet   . cholecalciferol (VITAMIN D) 1000 UNITS tablet Take 1,000 Units by mouth daily.    Marland Kitchen losartan (COZAAR) 50 MG tablet Take 50 mg by mouth daily.     No current facility-administered medications for this visit.    Allergies as of 08/24/2015 - Review Complete 08/24/2015  Allergen Reaction Noted  . Lipitor [atorvastatin]  10/14/2011    Vitals: BP 156/79 mmHg  Pulse 65  Ht 5\' 7"  (1.702 m)  Wt 149 lb 9.6 oz (67.858 kg)  BMI 23.43 kg/m2 Last Weight:  Wt Readings from Last 1 Encounters:  08/24/15 149 lb 9.6 oz (67.858 kg)   Last Height:   Ht Readings from Last 1 Encounters:  08/24/15 5\' 7"  (1.702 m)   Physical exam: Exam: Gen: NAD, conversant, well nourised,  well groomed                     CV: RRR, no MRG. No Carotid Bruits. No peripheral edema, warm, nontender Eyes: Conjunctivae clear without exudates or hemorrhage  Neuro: Detailed Neurologic Exam  Speech:    Speech is normal; fluent and spontaneous with normal comprehension.  Cognition:    The patient is oriented to person, place, and time;     recent and remote memory intact;     language fluent;     normal attention, concentration,     fund of knowledge Cranial Nerves:    The pupils are equal, round, and reactive to light. The fundi are normal and spontaneous venous pulsations are present. Visual fields are full to finger confrontation. Extraocular movements are intact. Trigeminal sensation is intact and the muscles of mastication are normal. The face is symmetric. The palate elevates in the midline. Hearing intact. Voice is normal. Shoulder shrug is normal. The tongue has normal motion without fasciculations.   Coordination:    Normal finger to nose and heel to shin. Normal rapid alternating movements.   Gait:    Heel-toe and tandem gait are normal.   Motor Observation:    No asymmetry, no atrophy, and no involuntary movements noted. Tone:    Normal muscle tone.     Posture:    Posture is normal. normal erect    Strength: Strength is 4+/5 right upper and 4/5 right hip flexion otherwise strength is V/V in the upper and lower limbs.      Sensation: Right-sided hemisensory loss in the arm, leg, hemithorax.     Reflex Exam:  DTR's:    Deep tendon reflexes in the upper and lower extremities are brisk bilaterally.   Toes:    The toes are downgoing bilaterally.   Clonus:    Clonus is absent.       Assessment/Plan:  75 year old male with several days of lightheadedness and dizziness and acute onset right-sided weakness and sensory changes. Concerning for cerebrovascular accident.  Labs ordered by primary care include B12, CBC with differential, CMP, TSH, lipid panel. I do not have these results  from his primary care. We'll request information. Increase to daily aspirin 325 mg, if CVA found on MRI may consider Plavix instead. MRI of the brain without contrast. Pending results will initiate further workup including possibly stroke evaluation.  I had a long d/w patient about possible stroke, risk for recurrent stroke/TIAs and answered questions.Continue aspirin for secondary stroke prevention and maintain strict control of hypertension with blood pressure goal below 130/90, i monitored her glucose for diabetes  and lipids with LDL cholesterol goal below 70 mg/dL.Patient has h/o statin intolerance hence may recommend new PCSK9 inhibitor like Praluent if MRI of the brain confirms stroke.  I also advised the patient to eat a healthy diet with plenty of whole grains, cereals, fruits and vegetables, exercise regularly and maintain ideal body weight.  CC: Dr. Simmie Davies, MD  [facility] [address] [place], [place] [address]  Phone [phone] Fax [phone]

## 2015-08-26 DIAGNOSIS — G8190 Hemiplegia, unspecified affecting unspecified side: Secondary | ICD-10-CM | POA: Insufficient documentation

## 2015-08-26 DIAGNOSIS — E785 Hyperlipidemia, unspecified: Secondary | ICD-10-CM | POA: Insufficient documentation

## 2015-08-26 DIAGNOSIS — I10 Essential (primary) hypertension: Secondary | ICD-10-CM | POA: Insufficient documentation

## 2015-08-26 DIAGNOSIS — R200 Anesthesia of skin: Secondary | ICD-10-CM | POA: Insufficient documentation

## 2015-08-29 ENCOUNTER — Ambulatory Visit
Admission: RE | Admit: 2015-08-29 | Discharge: 2015-08-29 | Disposition: A | Discharge status: Home / Self Care | Payer: Medicare Other | Source: Ambulatory Visit | Attending: Neurology | Admitting: Neurology

## 2015-08-29 DIAGNOSIS — R29818 Other symptoms and signs involving the nervous system: Secondary | ICD-10-CM | POA: Diagnosis not present

## 2015-08-29 DIAGNOSIS — R413 Other amnesia: Secondary | ICD-10-CM

## 2015-08-29 DIAGNOSIS — R4789 Other speech disturbances: Secondary | ICD-10-CM

## 2015-08-29 DIAGNOSIS — R202 Paresthesia of skin: Secondary | ICD-10-CM

## 2015-08-29 DIAGNOSIS — M6281 Muscle weakness (generalized): Secondary | ICD-10-CM

## 2015-08-31 ENCOUNTER — Telehealth: Payer: Self-pay | Admitting: Neurology

## 2015-08-31 ENCOUNTER — Other Ambulatory Visit: Payer: Self-pay | Admitting: Neurology

## 2015-08-31 DIAGNOSIS — R200 Anesthesia of skin: Secondary | ICD-10-CM

## 2015-08-31 DIAGNOSIS — G8190 Hemiplegia, unspecified affecting unspecified side: Secondary | ICD-10-CM

## 2015-08-31 DIAGNOSIS — R4789 Other speech disturbances: Secondary | ICD-10-CM

## 2015-08-31 DIAGNOSIS — R202 Paresthesia of skin: Secondary | ICD-10-CM

## 2015-08-31 DIAGNOSIS — I6359 Cerebral infarction due to unspecified occlusion or stenosis of other cerebral artery: Secondary | ICD-10-CM

## 2015-08-31 DIAGNOSIS — R413 Other amnesia: Secondary | ICD-10-CM

## 2015-08-31 DIAGNOSIS — R29818 Other symptoms and signs involving the nervous system: Secondary | ICD-10-CM

## 2015-08-31 DIAGNOSIS — M6281 Muscle weakness (generalized): Secondary | ICD-10-CM

## 2015-08-31 MED ORDER — CLOPIDOGREL BISULFATE 75 MG PO TABS: 75.0000 mg | ORAL_TABLET | Freq: Every day | ORAL | Status: DC

## 2015-08-31 NOTE — Telephone Encounter (Signed)
Spoke to patient about stroke as below. We will change his ASA to plavix. Order a CTA of the head and neck, echocardiogram, advised to have hgba1c checked at Dr. Eddie Dibbles office. He is intolerant of statins, discussed  PCSK9 drugs to Reduce Cardiovascular Events       IMPRESSION: This MRI of the brain without contrast shows the following: 1. 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 2. A few scattered T2/FLAIR hyperintense foci in the hemispheres consistent with minimal chronic microvascular ischemic change. 3. Mild generalized cortical atrophy. 4. There are no acute findings.

## 2015-09-04 NOTE — Telephone Encounter (Signed)
Dr Ahern- FYI 

## 2015-09-04 NOTE — Telephone Encounter (Signed)
Can you call Dr. Eddie Dibbles office and get the date and value of his last creatinine please? I need to order a CTA and need his kidney function. Also would you order an echocardiogram of the heart for stroke please? Thank you !

## 2015-09-04 NOTE — Telephone Encounter (Addendum)
Called Dr Eddie Dibbles office. Spoke to [employer]. She stated last Creatinine was done on August 10, 2015. Value was 0.98. She will also fax results to [phone]. Placed order for ECHO per Dr Jaynee Eagles request.

## 2015-09-04 NOTE — Addendum Note (Signed)
Addended by: Hope Pigeon on: 09/04/2015 12:21 PM   Modules accepted: Orders

## 2015-09-05 ENCOUNTER — Other Ambulatory Visit: Payer: Self-pay | Admitting: Neurology

## 2015-09-05 DIAGNOSIS — I6389 Other cerebral infarction: Secondary | ICD-10-CM

## 2015-09-05 NOTE — Telephone Encounter (Signed)
Thank you emma, I have ordered the CTA of the head and neck as well.

## 2015-09-08 ENCOUNTER — Other Ambulatory Visit: Payer: Self-pay

## 2015-09-08 ENCOUNTER — Ambulatory Visit (HOSPITAL_COMMUNITY): Payer: Medicare Other | Attending: Cardiology

## 2015-09-08 DIAGNOSIS — I639 Cerebral infarction, unspecified: Secondary | ICD-10-CM | POA: Insufficient documentation

## 2015-09-08 DIAGNOSIS — G8190 Hemiplegia, unspecified affecting unspecified side: Secondary | ICD-10-CM | POA: Diagnosis not present

## 2015-09-08 DIAGNOSIS — R200 Anesthesia of skin: Secondary | ICD-10-CM | POA: Diagnosis not present

## 2015-09-08 DIAGNOSIS — R413 Other amnesia: Secondary | ICD-10-CM | POA: Insufficient documentation

## 2015-09-08 DIAGNOSIS — R4789 Other speech disturbances: Secondary | ICD-10-CM | POA: Diagnosis not present

## 2015-09-08 DIAGNOSIS — M6289 Other specified disorders of muscle: Secondary | ICD-10-CM

## 2015-09-08 DIAGNOSIS — R202 Paresthesia of skin: Secondary | ICD-10-CM

## 2015-09-08 DIAGNOSIS — R29818 Other symptoms and signs involving the nervous system: Secondary | ICD-10-CM | POA: Diagnosis not present

## 2015-09-08 DIAGNOSIS — M6281 Muscle weakness (generalized): Secondary | ICD-10-CM

## 2015-09-08 DIAGNOSIS — E785 Hyperlipidemia, unspecified: Secondary | ICD-10-CM | POA: Insufficient documentation

## 2015-09-08 DIAGNOSIS — I10 Essential (primary) hypertension: Secondary | ICD-10-CM | POA: Insufficient documentation

## 2015-09-11 ENCOUNTER — Telehealth: Payer: Self-pay | Admitting: *Deleted

## 2015-09-11 NOTE — Telephone Encounter (Signed)
-----   Message from Melvenia Beam, MD sent at 09/10/2015  8:59 PM EDT ----- Echocardiogram was unremarkable. Systolic function normal, wall motion normal, no wall abnormalities. There was some trivial aortic regurgitation and some mild left decrease in ventricular relaxation (diastolic dysfunction grade 1). Nothing is not concerning. At next primary care appointment he should just review this with his pcp at next appointment not urgent, but there is no indication heart disease was involved in his stroke. thanks

## 2015-09-11 NOTE — Telephone Encounter (Signed)
LVM for pt to call about results. Gave [facility] phone number.  

## 2015-09-12 ENCOUNTER — Ambulatory Visit
Admission: RE | Admit: 2015-09-12 | Discharge: 2015-09-12 | Disposition: A | Discharge status: Home / Self Care | Payer: Medicare Other | Source: Ambulatory Visit | Attending: Neurology | Admitting: Neurology

## 2015-09-12 ENCOUNTER — Telehealth: Payer: Self-pay | Admitting: Neurology

## 2015-09-12 DIAGNOSIS — I6389 Other cerebral infarction: Secondary | ICD-10-CM

## 2015-09-12 MED ORDER — IOPAMIDOL (ISOVUE-370) INJECTION 76%
100.0000 mL | Freq: Once | INTRAVENOUS | Status: AC | PRN
  Administered 2015-09-12: 100 mL via INTRAVENOUS

## 2015-09-12 NOTE — Telephone Encounter (Signed)
Dr Jaynee Eagles- Juluis Rainier. CT results back, please advise.  Called and spoke to [place]. She stated radiologist requested they call us about results. I advised we can see results in EPIC and I will let Dr Jaynee Eagles know. She verbalized understanding.

## 2015-09-12 NOTE — Telephone Encounter (Signed)
Opal Sidles with [facility] is asking for a call back to get call report of an CT angio-neck test. Thanks!

## 2015-09-12 NOTE — Telephone Encounter (Signed)
Patient called back for test results.  Please call. Thanks!

## 2015-09-12 NOTE — Telephone Encounter (Signed)
Called and spoke to pt about results of ECHO per Dr Jaynee Eagles note. He verbalized understanding and would like copy sent to PCP, Hulan Fess. Sent copy via EPIC as requested. He will f/u with PCP and review with him at next appt.

## 2015-09-13 ENCOUNTER — Other Ambulatory Visit: Payer: Self-pay | Admitting: Neurology

## 2015-09-13 MED ORDER — ALIROCUMAB 75 MG/ML ~~LOC~~ SOPN: 1.0000 mL | PEN_INJECTOR | SUBCUTANEOUS | Status: DC

## 2015-09-19 ENCOUNTER — Telehealth: Payer: Self-pay | Admitting: Neurology

## 2015-09-19 NOTE — Telephone Encounter (Signed)
Spoke to patient, discussed the imaging below. Will review with our vascular doctors but I don;t see any need for any intervention at this time, unclear etiology of the vertebral dissection. Switched from ASA to Plavix. Intolerant to statins, ordered Praluent and will likely necessitate a PA.   CTA NECK  Aortic arch: Atherosclerosis. No dissection or dilatation. Three vessel branching  Right carotid system: Mild atheromatous wall thickening at the bifurcation. No stenosis or dissection. No vessel irregularity.  Left carotid system: Mixed density atherosclerotic plaque at the bifurcation. No flow limiting stenosis. No acute dissection. Mild mixed density plaque deposition along the lateral wall ICA at the skullbase with mild luminal irregularity.  Vertebral arteries:No proximal subclavian artery stenosis. Right dominant vertebral artery.  There is undulating narrowing of the left vertebral artery beginning at the V1 segment, consistent with dissection. The vessel tapers at the distal V3 segment with no V4 segment opacification. Thrombosis has been present since at least 08/29/2015.  Skeleton: No contributory or posttraumatic finding. Cervical disc and facet degeneration.  Other neck: No incidental mass or adenopathy seen in the neck.  CTA HEAD  Anterior circulation: Symmetric carotid arteries. Symmetric carotid branching. Mild atheromatous type narrowing at the proximal right M1 segment. No major branch occlusion. No vessel beading.  Posterior circulation: Left V4 segment occlusion with distal reconstitution and retrograde flow. There is a sizable left AICA which is visible to the level of the inferior cerebellum. Right PICA and small AICA are unremarkable. Smooth appearance of the basilar artery. Fetal type right PCA with small P1 segment. Smooth and patent posterior cerebral arteries.  Venous sinuses: Patent  Anatomic variants: Fetal type PCA on the  right.  Delayed phase: No mass or parenchymal enhancement.  These results will be called to the ordering clinician or representative by the Radiologist Assistant, and communication documented in the PACS or zVision Dashboard.  IMPRESSION: 1. 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 Patent left AICA which extends around the inferior cerebellum. 2. Mild luminal irregularity and calcification of the left cervical ICA at the skullbase, unusual location in the setting of mild atherosclerosis, question previous dissection and vasculopathy (fibromuscular dysplasia). 3. No acute intracranial finding. No change since brain MRI 08/29/2015.

## 2015-09-21 NOTE — Telephone Encounter (Signed)
Jeremy Church, I reviewed his imaging with our vascular neurologist and there is no further follow up needed on this, thanks!

## 2015-09-21 NOTE — Telephone Encounter (Signed)
Called and LVM for pt re: message below per Dr Jaynee Eagles. Gave [facility] phone number if he has further questions.

## 2015-09-21 NOTE — Telephone Encounter (Signed)
Jeremy Church, I reviewed his imaging with our vascular neurologist and there is no further follow up needed on this, thanks! We are just awaiting a prior auth for his Praluent thanks

## 2015-09-25 ENCOUNTER — Telehealth: Payer: Self-pay | Admitting: Neurology

## 2015-09-25 NOTE — Telephone Encounter (Signed)
Submitted PA request on covermymeds 09/25/15. Awaiting response to PA.

## 2015-09-25 NOTE — Telephone Encounter (Signed)
[facility] called to get Prior Auth. For Alirocumab ([place]) 26 MG/ML SO PA phone: ID:[id number] [facility] phone: [phone], may speak with anyone

## 2015-09-25 NOTE — Telephone Encounter (Signed)
Patient called to advise, he now has the list of Statin drugs he has tried and failed, Lipitor 10 mg 2001, stayed on until 2011, then Atorvastatin 20 mg until December 2011, had problems with this medication, was switched to Pravastatin until January 2015 then switched to Crestor 5 mg through July 2015, then as last resort, tried [employer] rice 60 mg until September 2016 then had problems with this.

## 2015-09-26 NOTE — Telephone Encounter (Signed)
Yes thank you !!! Please print out my records as well. I will review tomorrow, please give it to me then. thank you

## 2015-09-26 NOTE — Telephone Encounter (Signed)
Received notice from [employer] that PA request denied for praluent d/t no medical records submitted/ medication not prescribed by cardiologist, endocrinologist, or lipid specialist.

## 2015-09-26 NOTE — Telephone Encounter (Signed)
Dr Jaynee Eagles- I wrote an appeal letter for his Praluent. Can you review it and make sure it is ok? I can then fax. Thank you!

## 2015-09-27 NOTE — Telephone Encounter (Signed)
I actually put it with your notes from yesterday, you should have the copy of it! If not, I can print another one

## 2015-09-27 NOTE — Telephone Encounter (Signed)
Called [facility]. Advised we are writing an appeal letter. Waiting for doctor's signature and we will fax.  Spoke to [place]. She will make a note in pt chart that we will send appeal letter.

## 2015-09-27 NOTE — Telephone Encounter (Signed)
Faxed signed appeal letter for Praluent to pt insurance for expedited appeal. Fax: [phone]. Received fax confirmation. Reference request #: [id number]. Awaiting reply for appeal.

## 2015-09-27 NOTE — Telephone Encounter (Signed)
Ruzina with [facility] called to state PA was denied due to missing medical records. Will an appeal be done? They will open for 48 hr. Please call [phone], any one may help.

## 2015-10-03 ENCOUNTER — Other Ambulatory Visit: Payer: Self-pay | Admitting: *Deleted

## 2015-10-03 DIAGNOSIS — I6359 Cerebral infarction due to unspecified occlusion or stenosis of other cerebral artery: Secondary | ICD-10-CM

## 2015-10-03 MED ORDER — CLOPIDOGREL BISULFATE 75 MG PO TABS: 75.0000 mg | ORAL_TABLET | Freq: Every day | ORAL | Status: DC

## 2015-11-28 ENCOUNTER — Ambulatory Visit (INDEPENDENT_AMBULATORY_CARE_PROVIDER_SITE_OTHER): Payer: Medicare Other | Admitting: Neurology

## 2015-11-28 ENCOUNTER — Encounter: Payer: Self-pay | Admitting: Neurology

## 2015-11-28 DIAGNOSIS — I7774 Dissection of vertebral artery: Secondary | ICD-10-CM

## 2015-11-28 DIAGNOSIS — I63112 Cerebral infarction due to embolism of left vertebral artery: Secondary | ICD-10-CM | POA: Diagnosis not present

## 2015-11-28 NOTE — Progress Notes (Signed)
[facility]    Provider:  Dr Jaynee Eagles Referring Provider: Hulan Fess, MD Primary Care Physician:  Haywood Pao, MD  CC:  Right sided symptoms, weakness and sensory changes.   Interval history 11/28/2015:   Due to imaging results below, Switched from ASA to ASA 81mg  and Plavix for 3 months starting 09/19/2015 then just plavix. Intolerant to statins, ordered Praluent and was approved. Discussed cardiology "lipid clinic" and he can be referred, will see how the Praluent helps his ldl.  He is improving. His sensory changes have improved and almost back to baseline. His right arm is still weaker than the left. He declines physical therapy. Reviewed images below. He had a left vertebral dissection with infarct. Unknown preceding event to cause the vessel damage.   IMPRESSION:  This MRI of the brain without contrast shows the following: 1.    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 2.    A few scattered T2/FLAIR hyperintense foci in the hemispheres consistent with minimal chronic microvascular ischemic change. 3.    Mild generalized cortical atrophy. 4.    There are no acute findings.  IMPRESSION: 1. 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 Patent left AICA which extends around the inferior cerebellum. 2. Mild luminal irregularity and calcification of the left cervical ICA at the skullbase, unusual location in the setting of mild atherosclerosis, question previous dissection and vasculopathy (fibromuscular dysplasia). 3. No acute intracranial finding. No change since brain MRI 08/29/2015.  HPI:  Jeremy Church is a left-handed 75 y.o. male here as a referral from Dr. Rex Kras for right-sided symptoms. Past medical history of mixed hyperlipidemia, elevated fasting glucose, osteopenia, BPH, pseudothrombocytopenia, essential hypertension. In  February he broke out into hot sweats and the next day he was very unsteady, lightheaded, not feeling well. Here with his wife who also provides information. In March he also noticed that he couldn't feel hot water in his right hand, significant difference between the right and left hand. Now he has difficulty with dressing,  feels sensory changes in the right side of the body, sensory difference between the right thorax and left thorax. Denies numbness, fine motor difficulties, but he does describe weakness in the right leg. His right leg has given out, he fell. No joint problem strictly strength. No facial sensory changes or weakness, no aphasia, no dysphagia, he has word-finding difficulty. The whole right hand appears to be affected with the sensory changes. No symptoms of carpal tunnel syndrome. Symptoms are not worsening. They are however interfering with his daily activities of living including dressing and walking. He has a history of statin intolerance with elevated CPK and muscle aches. Denies neck pain or low back pain.  Reviewed notes, labs and imaging from outside physicians, which showed: Reviewed notes from [facility] physicians. Notes from appointments: stating that patient came in with complaints of lightheadedness and sensory changes. In February he had 3 episodes where he felt generally lightheaded and unsteady on his feet. No orthostatic changes were associated. No vertigo. Didn't actually fall but felt unsteady and had to hold onto things. After 3 days a symptoms resolved. After that he had temperature changes in the right hand. Patient is left-handed. 2 weeks prior to this appointment he was walking up some steps and felt like his right leg was going to give out for seconds and he almost fell. Examination was nonfocal. Labs ordered include  B12, CBC with differential, CMP, TSH, lipid panel. Depression screening and cognitive evaluation was normal.   LDL 124 HDL 44. Elevated CPK to 60 on  subsequent lab testing 171 in August 2014 after stopping Crestor.  Review of Systems: Patient complains of symptoms per HPI as well as the following symptoms: Aching muscles, no chest pain, no shortness of breath. Pertinent negatives per HPI. All others negative.   Social History   Social History  . Marital status: Married    Spouse name: Bethena Roys   . Number of children: 1  . Years of education: 38   Occupational History  . Retired    Social History Main Topics  . Smoking status: Never Smoker  . Smokeless tobacco: Not on file  . Alcohol use 2.4 oz/week    4 Glasses of wine per week     Comment: 1 glass wine/day  . Drug use: No  . Sexual activity: Not on file   Other Topics Concern  . Not on file   Social History Narrative   Lives with wife   Caffeine use: Coffee- 3-4 cups per day   No soda   Tea- rare     Family History  Problem Relation Age of Onset  . Hypertension Mother   . Hypertension Father   . Stroke Father     Past Medical History:  Diagnosis Date  . High cholesterol   . Hypertension   . Prostate neoplasm     Past Surgical History:  Procedure Laterality Date  . TRANSURETHRAL RESECTION OF PROSTATE  10/15/2011   Procedure: TRANSURETHRAL RESECTION OF THE PROSTATE (TURP);  Surgeon: Fredricka Bonine, MD;  Location: [facility];  Service: Urology;  Laterality: N/A;  CYSTO AND GYRUS NEEDED    Current Outpatient Prescriptions  Medication Sig Dispense Refill  . Alirocumab (PRALUENT) 75 MG/ML SOPN Inject 1 mL into the skin every 14 (fourteen) days. 2 pen 11  . Cholecalciferol (VITAMIN D PO) Take 5,000 Units by mouth daily.    . clopidogrel (PLAVIX) 75 MG tablet Take 1 tablet (75 mg total) by mouth daily. 90 tablet 3  . losartan (COZAAR) 50 MG tablet Take 50 mg by mouth daily.     No current facility-administered medications for this visit.     Allergies as of 11/28/2015 - Review Complete 08/24/2015  Allergen Reaction Noted  .  Lipitor [atorvastatin]  10/14/2011    Vitals: BP (!) 159/78 (BP Location: Left Arm, Patient Position: Sitting, Cuff Size: Normal)   Pulse 69   Ht 5\' 7"  (1.702 m)   Wt 149 lb 6.4 oz (67.8 kg)   BMI 23.40 kg/m  Last Weight:  Wt Readings from Last 1 Encounters:  11/28/15 149 lb 6.4 oz (67.8 kg)   Last Height:   Ht Readings from Last 1 Encounters:  11/28/15 5\' 7"  (1.702 m)     Physical exam: Exam: Gen: NAD, conversant, well nourised,  well groomed                     CV: RRR, no MRG. No Carotid Bruits. No peripheral edema, warm, nontender Eyes: Conjunctivae clear without exudates or hemorrhage  Neuro: Detailed Neurologic Exam  Speech:    Speech is normal; fluent and spontaneous with normal comprehension.  Cognition:    The patient is oriented to person, place, and time;     recent and remote memory intact;     language fluent;     normal attention, concentration,  fund of knowledge Cranial Nerves:    The pupils are equal, round, and reactive to light. The fundi are normal and spontaneous venous pulsations are present. Visual fields are full to finger confrontation. Extraocular movements are intact. Trigeminal sensation is intact and the muscles of mastication are normal. The face is symmetric. The palate elevates in the midline. Hearing intact. Voice is normal. Shoulder shrug is normal. The tongue has normal motion without fasciculations.   Coordination:    Normal finger to nose and heel to shin. Normal rapid alternating movements.   Gait:    Heel-toe and tandem gait are normal.   Motor Observation:    No asymmetry, no atrophy, and no involuntary movements noted. Tone:    Normal muscle tone.    Posture:    Posture is normal. normal erect    Strength: Strength is 5-/5 right upper and 4+/5 right hip flexion otherwise strength is V/V in the upper and lower limbs. Improved.     Sensation: Right-sided hemisensory loss in the arm, leg, hemithorax. Improved.       Reflex Exam:  DTR's:    Deep tendon reflexes in the upper and lower extremities are brisk bilaterally.   Toes:    The toes are downgoing bilaterally.   Clonus:    Clonus is absent.       Assessment/Plan:  75 year old male with several days of lightheadedness and dizziness and acute onset right-sided weakness and sensory changes. 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 Switched to ASA and Plavix for 3 months now just Plavix. He is intolerant to statins and trying Praluent. He is on Praluent and it was approved.    Continue Plavix.    I had a long d/w patient about possible stroke, risk for recurrent stroke/TIAs and answered questions.Continue plavix for secondary stroke prevention and maintain strict control of hypertension with blood pressure goal below 130/90, i monitored her glucose for diabetes  and lipids with LDL cholesterol goal below 70 mg/dL.Patient has h/o statin intolerance hence continue Praluent.  I also advised the patient to eat a healthy diet with plenty of whole grains, cereals, fruits and vegetables, exercise regularly and maintain ideal body weight. Sarina Ill, MD  [facility] [address] [place], [place] [address]  Phone [phone] Fax [phone]  A total of 30  minutes was spent face-to-face with this patient. Over half this time was spent on counseling patient on the vertebral dissection and stroke diagnosis and different diagnostic and therapeutic options available.

## 2015-11-28 NOTE — Patient Instructions (Signed)
Overall you are doing great but I do want to suggest a few things today:   Remember to drink plenty of fluid, eat healthy meals and do not skip any meals. Try to eat protein with a every meal and eat a healthy snack such as fruit or nuts in between meals. Try to keep a regular sleep-wake schedule and try to exercise daily, particularly in the form of walking, 20-30 minutes a day, if you can.   As far as your medications are concerned, I would like to suggest: Continue current medications  I would like to see you back in 6 months to a year, sooner if we need to. Please call us with any interim questions, concerns, problems, updates or refill requests.    Our phone number is [phone]. We also have an after hours call service for urgent matters and there is a physician on-call for urgent questions. For any emergencies you know to call 911 or go to the nearest emergency room

## 2015-11-29 ENCOUNTER — Encounter: Payer: Self-pay | Admitting: Neurology

## 2015-11-29 DIAGNOSIS — I63112 Cerebral infarction due to embolism of left vertebral artery: Secondary | ICD-10-CM | POA: Insufficient documentation

## 2015-11-29 DIAGNOSIS — I7774 Dissection of vertebral artery: Secondary | ICD-10-CM | POA: Insufficient documentation

## 2015-12-22 ENCOUNTER — Encounter: Payer: Self-pay | Admitting: Neurology

## 2015-12-26 NOTE — Telephone Encounter (Signed)
Called and spoke to Leona at specialty pharmacy. She stated they spoke to pt on 12/22/15 that appeal had been approved and pt still had to pay $468 for medication. They gave patient information on copay card as well.

## 2015-12-27 ENCOUNTER — Telehealth: Payer: Self-pay | Admitting: *Deleted

## 2015-12-27 NOTE — Telephone Encounter (Signed)
Patient stopped in office this am and signed paperwork. Called PCP office and spoke with Rodena Piety. She stated patient's most recent LDL was 78 and drawn on 12/04/2015. She is going to fax results to [phone] for our records, attn Terrence Dupont.

## 2015-12-27 NOTE — Telephone Encounter (Signed)
Faxed completed paperwork by AA, MD to [employer] at [phone]. Received confirmation.

## 2015-12-27 NOTE — Telephone Encounter (Signed)
LVM for patient that he can stop by anytime to sign his portion of the praluent assistance form. Gave [facility] office hours.

## 2015-12-28 NOTE — Telephone Encounter (Signed)
Received fax from [employer] notifying our office that patient has enrolled in [employer].  Patient's ID #: [id number]  They stated they will contact patient's insurance to verify coverage for praluent.

## 2015-12-29 NOTE — Telephone Encounter (Signed)
Received fax from [employer]. They have triaged the patient's enrollment form and prescription to [employer] and they included information on preliminary benefits: Annual deductible: $165 Co-pay: $224 Co-insurance: 20% Annual out of pocket: $4,934 Copay card eligible: no  Phone to contact for any questions: [phone].

## 2016-04-02 ENCOUNTER — Telehealth: Payer: Self-pay | Admitting: *Deleted

## 2016-04-02 NOTE — Telephone Encounter (Signed)
LVM for pt to call office back. Advised I received notification from [employer] that he will have to contact them to give new insurance information if applicable at start of new year. Need to get medication approved again. Asked him to call back to discuss. Gave [facility] phone number.

## 2016-04-03 NOTE — Telephone Encounter (Signed)
Called and spoke with patient. Advised [employer] is trying to get approval for praluent again. Need him to call and verify new insurance information. He stated he is staying with same insurance plan via [facility]. He is making no changes. He has also been getting samples through Dr Osborne Casco because his copay for the medication is high. He said his first copay was $450 then it went down to $350. He has applied for multiple patient assistance but does not qualify. He stated that labs done with Dr Osborne Casco showed Praluent was helping. He wants to continue medication. I gave him number to [employer] [phone] to call and verify new insurance information. Advised him to call back if he has any further questions or concerns.

## 2016-04-03 NOTE — Telephone Encounter (Signed)
Pt returned RN's call. He can reached at [phone]

## 2016-04-04 ENCOUNTER — Encounter: Payer: Self-pay | Admitting: Neurology

## 2016-04-04 NOTE — Telephone Encounter (Signed)
Called pt back. He stated they advised him he would have to get PA on medication again. He does not have new insurance information with him but he will call back with information. Advised I might have to wait until that insurance is in effect to do PA. I will check on this and call back to update him once I know more. He verbalized understanding.  *Please get new insurance card info from him if he calls

## 2016-04-04 NOTE — Telephone Encounter (Signed)
Patient is calling stating Alirocumab (PRALUENT) 56 MG/ML SOPN needs PA.

## 2016-04-08 NOTE — Telephone Encounter (Signed)
Patient emailed copy of new insurance card. Also received fax request to complete PA for praluent. Currently working on completing this.

## 2016-05-01 ENCOUNTER — Encounter: Payer: Self-pay | Admitting: Neurology

## 2016-05-02 NOTE — Telephone Encounter (Addendum)
Received fax from [employer] with them requesting additional information for PA. They also want lab results documenting sustained LDL reduction from pre=treatment baseline while on praluent therapy.  I called and spoke with Chinera from Dr Hulan Fess office and received LDL results as far back as 2015. Received results via fax.   LVM for Rodena Piety at Dr Osborne Casco office for her to fax what they have on file for lab values for LDL. Gave her fax [phone] to fax results to. Gave patient name/DOB and asked her to call back to let me know she received message. Advised we are trying to do PA on his praluent and get it approved.  Received results via fax from [facility].   I faxed PA with additional information to [employer]. Faxed to specialty [phone] but faxed failed for both.  Faxed to standard fax #: [phone] per [facility] request when I called and advised specialty fax number not working. Received confirmation. Waiting on response.

## 2016-05-03 NOTE — Telephone Encounter (Signed)
Submitted appeal letter to [employer] appeals and grievances. Fax: [phone]. Received confirmation. Awaiting response from insurance.

## 2016-05-06 ENCOUNTER — Encounter: Payer: Self-pay | Admitting: Neurology

## 2016-05-06 NOTE — Telephone Encounter (Signed)
Jeremy Church/[employer] [phone] said the appeal has been approved. A confirmation letter will be sent by mail.  Auth # [id number] effective 05/01/16-04/21/17

## 2016-05-06 NOTE — Telephone Encounter (Signed)
Noted, thank you

## 2016-06-04 ENCOUNTER — Ambulatory Visit (INDEPENDENT_AMBULATORY_CARE_PROVIDER_SITE_OTHER): Payer: Medicare Other | Admitting: Neurology

## 2016-06-04 ENCOUNTER — Encounter: Payer: Self-pay | Admitting: Neurology

## 2016-06-04 DIAGNOSIS — I6359 Cerebral infarction due to unspecified occlusion or stenosis of other cerebral artery: Secondary | ICD-10-CM

## 2016-06-04 MED ORDER — CLOPIDOGREL BISULFATE 75 MG PO TABS: 75.0000 mg | ORAL_TABLET | Freq: Every day | ORAL | 5 refills | Status: AC

## 2016-06-04 NOTE — Patient Instructions (Addendum)
Remember to drink plenty of fluid, eat healthy meals and do not skip any meals. Try to eat protein with a every meal and eat a healthy snack such as fruit or nuts in between meals. Try to keep a regular sleep-wake schedule and try to exercise daily, particularly in the form of walking, 20-30 minutes a day, if you can.   As far as your medications are concerned, I would like to suggest: Continue current medication  I would like to see you back in 1 year or follow up with Dr. Osborne Casco, sooner if we need to. Please call us with any interim questions, concerns, problems, updates or refill requests.   Our phone number is [phone]. We also have an after hours call service for urgent matters and there is a physician on-call for urgent questions. For any emergencies you know to call 911 or go to the nearest emergency room

## 2016-06-04 NOTE — Progress Notes (Signed)
[facility]    Provider:  Dr Jaynee Eagles Referring Provider: Hulan Fess, MD Primary Care Physician:  Haywood Pao, MD  CC: Left parietal lobe infarct.   Interval history 06/04/2016:   Praluent was provided to Lenon Oms on 09/13/2015 for HLD in the setting of infarction and chronic micro-vascular ischemic brain disease or [id number] since 08/24/2015. The insurance company indicated that [place] is not covered by Medicare part D because submission of medical records were not given and medication was not prescribed by either a cardiologist, endocrinologist, lipid specialist.    Lenon Oms has tried and failed the following medications: Lipitor 10 mg in 2001, which he stayed on until 2011. Atorvastatin, 20 mg until December 2011, which he had side effects. He switched to Pravastatin until January 2015. Pravastatin was ineffective. He then tried Crestor 5 mg through July 2015. This was also ineffective. He then tried Red yeast rice 60 mg until September 2016 and had side effects from this. Patient is unable to tolerate high-intensity statins.   Eaven Caraher has tried and failed multiple statin medications that have not successfully controlled his cholesterol levels. Patient has documented muscle necrosis/mild rhabdomyolysis. He has a past medical history of hyperlipidemia, elevated fasting glucose, and essential hypertension. He has a history of statin intolerance as previously mentioned with elevated CK and muscle aches.   There is significant documentation that patient has a sustained LDL reduction from pre-treatment baseline while on Praluent therapy. I feel at this time, the patient is well controlled on Praluent. Changing the medication could cause an increased risk of stroke.  Based on the above facts, I believe treatment with Praluent is appropriate and medically necessary for this Patient. We will refer to Cardiology so that patient can be seen in their lipid clinic  who may help with Praluent or other medication management for his HLD.  He feels better. The temperature sensitivity has improved. Strength is improved now. His last LDL was 76 on Praluent. Right thumb pain.   Interval history 11/28/2015:    Due to imaging results below, Switched from ASA to ASA 81mg  and Plavix for 3 months starting 09/19/2015 then just plavix. Intolerant to statins, ordered Praluent and was approved. Discussed cardiology "lipid clinic" and he can be referred, will see how the Praluent helps his ldl.  He is improving. His sensory changes have improved and almost back to baseline. His right arm is still weaker than the left. He declines physical therapy. Reviewed images below. He had a left vertebral dissection with infarct. Unknown preceding event to cause the vessel damage.   IMPRESSION: This MRI of the brain without contrast shows the following: 1. 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 2. A few scattered T2/FLAIR hyperintense foci in the hemispheres consistent with minimal chronic microvascular ischemic change. 3. Mild generalized cortical atrophy. 4. There are no acute findings.  IMPRESSION: 1. 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 Patent left AICA which extends around the inferior cerebellum. 2. Mild luminal irregularity and calcification of the left cervical ICA at the skullbase, unusual location in the setting of mild atherosclerosis, question previous dissection and vasculopathy (fibromuscular dysplasia). 3. No acute intracranial finding. No change since brain MRI 08/29/2015.  [id number] Rotruckis a left-handed 76 y.o.malehere as a referral from Dr. Murlean Caller right-sided symptoms. Past medical history of mixed hyperlipidemia, elevated fasting glucose, osteopenia, BPH, pseudothrombocytopenia, essential  hypertension. In February he  broke out into hot sweats and the next day he was very unsteady, lightheaded, not feeling well. Here with his wife who also provides information. In March he also noticed that he couldn't feel hot water in his right hand, significant difference between the right and left hand. Now he has difficulty with dressing, feels sensory changes in the right side of the body, sensory difference between the right thorax and left thorax. Denies numbness, fine motor difficulties, but he does describe weakness in the right leg. His right leg has given out, he fell. No joint problem strictly strength. No facial sensory changes or weakness, no aphasia, no dysphagia, he has word-finding difficulty. The whole right hand appears to be affected with the sensory changes. No symptoms of carpal tunnel syndrome. Symptoms are not worsening. They are however interfering with his daily activities of living including dressing and walking. He has a history of statin intolerance with elevated CPK and muscle aches. Denies neck pain or low back pain.  Reviewed notes, labs and imaging from outside physicians, which showed: Reviewed notes from [facility] physicians. Notes from appointments: stating that patient came in with complaints of lightheadedness and sensory changes. In February he had 3 episodes where he felt generally lightheaded and unsteady on his feet. No orthostatic changes were associated. No vertigo. Didn't actually fall but felt unsteady and had to hold onto things. After 3 days a symptoms resolved. After that he had temperature changes in the right hand. Patient is left-handed. 2 weeks prior to this appointment he was walking up some steps and felt like his right leg was going to give out for seconds and he almost fell. Examination was nonfocal. Labs ordered include B12, CBC with differential, CMP, TSH, lipid panel. Depression screening and cognitive evaluation was normal.   LDL 124 HDL 44. Elevated CPK  to 60 on subsequent lab testing 171 in August 2014 after stopping Crestor.  Review of Systems: Patient complains of symptoms per HPI as well as the following symptoms: Aching muscles, no chest pain, no shortness of breath. Pertinent negatives per HPI. All others negative.    Social History   Social History  . Marital status: Married    Spouse name: Bethena Roys   . Number of children: 1  . Years of education: 33   Occupational History  . Retired    Social History Main Topics  . Smoking status: Never Smoker  . Smokeless tobacco: Never Used  . Alcohol use 2.4 oz/week    4 Glasses of wine per week  . Drug use: No  . Sexual activity: Not on file   Other Topics Concern  . Not on file   Social History Narrative   Lives with wife   Caffeine use: Coffee- 3-4 cups per day   No soda   Tea- rare    Left-handed    Family History  Problem Relation Age of Onset  . Hypertension Mother   . Hypertension Father   . Stroke Father     Past Medical History:  Diagnosis Date  . High cholesterol   . Hypertension   . Prostate neoplasm     Past Surgical History:  Procedure Laterality Date  . TRANSURETHRAL RESECTION OF PROSTATE  10/15/2011   Procedure: TRANSURETHRAL RESECTION OF THE PROSTATE (TURP);  Surgeon: Fredricka Bonine, MD;  Location: [facility];  Service: Urology;  Laterality: N/A;  CYSTO AND GYRUS NEEDED    Current Outpatient Prescriptions  Medication Sig Dispense Refill  . Alirocumab ([place])  75 MG/ML SOPN Inject 1 mL into the skin every 14 (fourteen) days. 2 pen 11  . Cholecalciferol (VITAMIN D PO) Take 5,000 Units by mouth daily.    . clopidogrel (PLAVIX) 75 MG tablet Take 1 tablet (75 mg total) by mouth daily. 90 tablet 5  . losartan (COZAAR) 50 MG tablet Take 50 mg by mouth daily.     No current facility-administered medications for this visit.     Allergies as of 06/04/2016 - Review Complete 06/04/2016  Allergen Reaction Noted  . Lipitor  [atorvastatin]  10/14/2011    Vitals: BP 138/82   Pulse 61   Ht 5' 6.5" (1.689 m)   Wt 152 lb 6.4 oz (69.1 kg)   BMI 24.23 kg/m  Last Weight:  Wt Readings from Last 1 Encounters:  06/04/16 152 lb 6.4 oz (69.1 kg)   Last Height:   Ht Readings from Last 1 Encounters:  06/04/16 5' 6.5" (1.689 m)       Physical exam: Exam: Gen: NAD, conversant, well nourised, well groomed  CV: RRR, no MRG. No Carotid Bruits. No peripheral edema, warm, nontender Eyes: Conjunctivae clear without exudates or hemorrhage  Neuro: Detailed Neurologic Exam  Speech: Speech is normal; fluent and spontaneous with normal comprehension.  Cognition: The patient is oriented to person, place, and time;  recent and remote memory intact;  language fluent;  normal attention, concentration,  fund of knowledge Cranial Nerves: The pupils are equal, round, and reactive to light. The fundi are normal and spontaneous venous pulsations are present. Visual fields are full to finger confrontation. Extraocular movements are intact. Trigeminal sensation is intact and the muscles of mastication are normal. The face is symmetric. The palate elevates in the midline. Hearing intact. Voice is normal. Shoulder shrug is normal. The tongue has normal motion without fasciculations.   Coordination: Normal finger to nose and heel to shin. Normal rapid alternating movements.   Gait: Heel-toe and tandem gait are normal.   Motor Observation: No asymmetry, no atrophy, and no involuntary movements noted. Tone: Normal muscle tone.   Posture: Posture is normal. normal erect  Strength: Strength is 5-/5 right upper and 4+/5 right hip flexion otherwise strength is V/V in the upper and lower limbs. Improved.  Sensation: Right-sided hemisensory loss in the arm, leg, hemithorax. Improved.   Reflex Exam:  DTR's: Deep tendon reflexes in the upper and lower  extremities are brisk bilaterally.  Toes: The toes are downgoing bilaterally.  Clonus: Clonus is absent.      Assessment/Plan:76 year old male with several days of lightheadedness and dizziness and acute onset right-sided weakness and sensory changes. 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 Switched to ASA and Plavix for 3 months now just Plavix. He is intolerant to statins with rhabdo and documented elevated CK (see above HPI for details) but having a difficult time getting Praluent approved.   - I believe treatment with Praluent or other medication in this class is appropriate and medically necessary for this Patient(see details above in hpi interval history). We will refer to Cardiology(Tiffany Oval Linsey) so that patient can be seen in their lipid clinic who may help with Praluent approval or other medication management for his HLD.  - Continue Plavix for stroke prevention.    I had a long d/w patient about stroke, risk for recurrent stroke/TIAs and answered questions.Continue plavix for secondary stroke prevention and maintain strict control  of hypertension with blood pressure goal below 130/90, i monitored her glucose for diabetes and lipids with LDL cholesterol goal below 70 mg/dL.Patient has h/o statin intolerance hence continue Praluent or another medication in this class. I also advised the patient to eat a healthy diet with plenty of whole grains, cereals, fruits and vegetables, exercise regularly and maintain ideal body weight.  Sarina Ill, MD  [facility] [address] [place],  [address]  Phone [phone] Fax [phone]  A total of 30  minutes was spent face-to-face with this patient. Over half this time was spent on counseling patient on the vertebral dissection and stroke diagnosis  and different diagnostic and therapeutic options available.

## 2016-06-20 ENCOUNTER — Telehealth: Payer: Self-pay

## 2016-06-20 NOTE — Telephone Encounter (Signed)
Received faxed labs from Dr. Loren Racer office (CMP, CBC, and lipid panel WNL except LDL/HDL ratio slightly low at 1.6). Placed in MD file for review.

## 2016-07-31 NOTE — Progress Notes (Signed)
Cardiology Office Note   Date:  08/01/2016   ID:  Jeremy Church, DOB [birth year], MRN [id number]  PCP:  Jeremy Pao, MD  Cardiologist:   Jeremy Latch, MD   No chief complaint on file.     History of Present Illness: Jeremy Church is a 76 y.o. male with hypertension, hyperlipidemia and prior stroke who presents for management of hyperlipidemia. He has been working with Jeremy Church for management of his hyperlipidemia and has previously failed atorvastatin, pravastatin and rosuvastatin.  He either failed to reach target or developed side effects.  Jeremy Church tried to prescribe Praluent but it was denied by his insurance, as it requires a cardiologist, endocrinologist or lipid specialist to prescribe.     Jeremy Church has been feeling well. In the past whenever he tried statins the developed myalgias that were most noticeable when he tried to walk up steps. He has been taking Praluent the last year. He notes that his cholesterol improved very quickly after he started it.  He was able to get some samples from his PCP but does laments that his co-pay is over $300 per month when he has to buy himself. His LDL was 76 when checked 05/2016.  He has been feeling well and exercising regularly.  He exercises 4-5 days weekly at [employer].  He does 30 minutes of cardio followed by 30 minutes of abd and weights.  he has no chest pain or shortness of breath with this activity. He denies lower extremity edema, orthopnea, or PND. His blood pressure at home has been in the 120s to 130s.  When it is >130 he can usually  attributed this to a salty meal.  Past Medical History:  Diagnosis Date  . High cholesterol   . Hypertension   . Prostate neoplasm     Past Surgical History:  Procedure Laterality Date  . TRANSURETHRAL RESECTION OF PROSTATE  10/15/2011   Procedure: TRANSURETHRAL RESECTION OF THE PROSTATE (TURP);  Surgeon: Jeremy Bonine, MD;  [facility];   Service: Urology;  Laterality: N/A;  CYSTO AND GYRUS NEEDED     Current Outpatient Prescriptions  Medication Sig Dispense Refill  . Alirocumab (PRALUENT) 75 MG/ML SOPN Inject 1 mL into the skin every 14 (fourteen) days. 2 pen 11  . Cholecalciferol (VITAMIN D PO) Take 5,000 Units by mouth daily.    . clopidogrel (PLAVIX) 75 MG tablet Take 1 tablet (75 mg total) by mouth daily. 90 tablet 5  . losartan (COZAAR) 50 MG tablet Take 50 mg by mouth daily.     No current facility-administered medications for this visit.     Allergies:   Lipitor [atorvastatin]    Social History:  The patient  reports that he has never smoked. He has never used smokeless tobacco. He reports that he drinks about 2.4 oz of alcohol per week . He reports that he does not use drugs.   Family History:  [name] family history includes Heart attack in his brother and mother; Heart failure in his sister; Hypertension in his father and mother; Stroke in his father.    ROS:  Please see the history of present illness.   Otherwise, review of systems are positive for none.   All other systems are reviewed and negative.    PHYSICAL EXAM: VS:  BP 140/62   Pulse 61   Ht 5\' 7"  (1.702 m)   Wt 69.4 kg (153 lb)   BMI 23.96 kg/m  ,  BMI Body mass index is 23.96 kg/m. GENERAL:  Well appearing HEENT:  Pupils equal round and reactive, fundi not visualized, oral mucosa unremarkable NECK:  No jugular venous distention, waveform within normal limits, carotid upstroke brisk and symmetric, no bruits, no thyromegaly LYMPHATICS:  No cervical adenopathy LUNGS:  Clear to auscultation bilaterally HEART:  RRR.  PMI not displaced or sustained,S1 and S2 within normal limits, no S3, no S4, no clicks, no rubs, I/VI systolic murmur at LUSB ABD:  Flat, positive bowel sounds normal in frequency in pitch, no bruits, no rebound, no guarding, no midline pulsatile mass, no hepatomegaly, no splenomegaly EXT:  2 plus pulses throughout, no edema,  no cyanosis no clubbing SKIN:  No rashes no nodules NEURO:  Cranial nerves II through XII grossly intact, motor grossly intact throughout PSYCH:  Cognitively intact, oriented to person place and time    EKG:  EKG is ordered today. The ekg ordered for/12/18 demonstrates sinus rhythm. Rate 61 bpm. Left anterior fascicular block.   Recent Labs: No results found for requested labs within last 8760 hours.   06/03/16: Sodium 139, potassium 4.5, BUN 18, creatinine 0.9 WBC 4.38, hemoglobin 16.4, hematocrit 51, platelets 144 Total cholesterol 151, triglycerides 139, HDL 47, LDL 76  Lipid Panel No results found for: CHOL, TRIG, HDL, CHOLHDL, VLDL, LDLCALC, LDLDIRECT    Wt Readings from Last 3 Encounters:  08/01/16 69.4 kg (153 lb)  06/04/16 69.1 kg (152 lb 6.4 oz)  11/28/15 67.8 kg (149 lb 6.4 oz)      ASSESSMENT AND PLAN:  # Hyperlipidemia:  LDL goal is <70 given his prior stroke. It was 76 when last checked 05/2016. He has been taking Praluent intermittently due to cost and getting samples from his PCP.  We will get him started with our lipid clinic and see if he is a candidate for patient assistance.  # Hypertension:  Blood pressure is above goal both initially and on repeat. However he checks his blood pressure regularly at home and it has been well-controlled. I've asked him to continue monitoring this and call us if his blood pressures greater than 130/80. Continue losartan.   # Prior stroke: Continue clopidogrel and Pralnuent.   Current medicines are reviewed at length with the patient today.  The patient does not have concerns regarding medicines.  The following changes have been made:  no change  Labs/ tests ordered today include:  No orders of the defined types were placed in this encounter.    Disposition:   FU with Jeremy Grandmaison C. Oval Linsey, MD, [facility] in 1 year.    This note was written with the assistance of speech recognition software.  Please excuse any transcriptional  errors.  Signed, Jeremy Hinchman C. Oval Linsey, MD, [facility]  08/01/2016 1:06 PM    [facility]

## 2016-08-01 ENCOUNTER — Ambulatory Visit (INDEPENDENT_AMBULATORY_CARE_PROVIDER_SITE_OTHER): Payer: Medicare Other | Admitting: Cardiovascular Disease

## 2016-08-01 ENCOUNTER — Encounter: Payer: Self-pay | Admitting: Cardiovascular Disease

## 2016-08-01 VITALS — BP 140/62 | HR 61 | Ht 67.0 in | Wt 153.0 lb

## 2016-08-01 DIAGNOSIS — E7800 Pure hypercholesterolemia, unspecified: Secondary | ICD-10-CM

## 2016-08-01 DIAGNOSIS — I63113 Cerebral infarction due to embolism of bilateral vertebral arteries: Secondary | ICD-10-CM | POA: Diagnosis not present

## 2016-08-01 DIAGNOSIS — I10 Essential (primary) hypertension: Secondary | ICD-10-CM

## 2016-08-01 NOTE — Patient Instructions (Signed)
Medication Instructions:  Your physician recommends that you continue on your current medications as directed. Please refer to the Current Medication list given to you today.  Labwork: REQUESTING LIPID PANEL FROM DR Osborne Casco  Testing/Procedures: NONE  Follow-Up: Your physician wants you to follow-up in: 1 [place] will receive a reminder letter in the mail two months in advance. If you don't receive a letter, please call our office to schedule the follow-up appointment.  Your physician recommends that you schedule a follow-up appointment in: [place]   Any Other Special Instructions Will Be Listed Below (If Applicable).  If you need a refill on your cardiac medications before your next appointment, please call your pharmacy.

## 2016-08-15 ENCOUNTER — Ambulatory Visit (INDEPENDENT_AMBULATORY_CARE_PROVIDER_SITE_OTHER): Payer: Medicare Other | Admitting: Pharmacist Clinician (PhC)/ Clinical Pharmacy Specialist

## 2016-08-15 DIAGNOSIS — E7800 Pure hypercholesterolemia, unspecified: Secondary | ICD-10-CM | POA: Diagnosis not present

## 2016-08-15 NOTE — Progress Notes (Signed)
08/20/2016 Jeremy Church 1941-01-18 [id number]   HPI:  Jeremy Church is a 76 y.o. male patient of Dr Oval Linsey, who presents today for a lipid clinic evaluation.  He has been on Praluent 75 mg for about a year, has had concerns about cost and has been getting samples every other month from PCP, but feels the every other month co-pay is becoming cost prohibitive.  With the 75 mg dose his most recent LDL cholesterol was still not to goal, at 76.   His cardiac history is significant for prior stroke, hypertension and hyperlipidemia.    Current Medications:  praluent 75 mg q14 d - started about 1 year ago, buys every other month, gets remainder in samples from PCP   Intolerant/previously tried:  Was on atorvastatin for 8-10 years before developing myalgias.  Patient believes this was between 2002 and about 2012, he cannot recall when strength.  After that he tried rosuvastatin, pravastatin and even rosuvastatin 5 mg weekly, none of which he was able to tolerate for more that about 2-3 weeks.    Patient notes same myalgias appeared when he tried OTC red yeast rice.   Family history:   Father had stroke at 42, died of HF at 53  Mother and sister both died from [place], at 57 and 10 years respectively       Brother with MI at 58 now 73  Diet:   Mostly home, low sodium, frozen veggies, not canned, low carbohydrates (no pasta or white bread)  Exercise:    Goes to gym 4-5 days per week, treadmill for 30 minutes, then resistance/strength, especially abdominal exercises  Labs:   06/03/16:  TC 151, TG 139, HDL 47, LDL 76  (Praluent 75 mg)  Current Outpatient Prescriptions  Medication Sig Dispense Refill  . Alirocumab (PRALUENT) 75 MG/ML SOPN Inject 1 mL into the skin every 14 (fourteen) days. 2 pen 11  . Cholecalciferol (VITAMIN D PO) Take 5,000 Units by mouth daily.    . clopidogrel (PLAVIX) 75 MG tablet Take 1 tablet (75 mg total) by mouth daily. 90 tablet 5  . losartan (COZAAR) 50 MG tablet Take 50 mg by  mouth daily.     No current facility-administered medications for this visit.     Allergies  Allergen Reactions  . Lipitor [Atorvastatin] Other (See Comments)    Muscle pain and weakness    Past Medical History:  Diagnosis Date  . High cholesterol   . Hypertension   . Prostate neoplasm     There were no vitals taken for this visit.   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  Has been on Praluent 75 mg every 2 weeks, with LDL improved to 76, but not yet at goal.  Will increase dose to 150 mg as well as submit information to [employer] program to save the patient some money.     Tommy Medal PharmD CPP [facility]

## 2016-08-20 ENCOUNTER — Encounter: Payer: Self-pay | Admitting: Pharmacist Clinician (PhC)/ Clinical Pharmacy Specialist

## 2016-08-20 MED ORDER — ALIROCUMAB 150 MG/ML ~~LOC~~ SOPN: 150.0000 mg | PEN_INJECTOR | SUBCUTANEOUS | 12 refills | Status: DC

## 2016-08-20 NOTE — Assessment & Plan Note (Signed)
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  Has been on Praluent 75 mg every 2 weeks, with LDL improved to 76, but not yet at goal.  Will increase dose to 150 mg as well as submit information to [employer] program to save the patient some money.

## 2016-08-22 ENCOUNTER — Other Ambulatory Visit: Payer: Self-pay | Admitting: Pharmacist Clinician (PhC)/ Clinical Pharmacy Specialist

## 2016-09-24 ENCOUNTER — Telehealth: Payer: Self-pay | Admitting: Cardiovascular Disease

## 2016-09-24 MED ORDER — ALIROCUMAB 150 MG/ML ~~LOC~~ SOPN: 150.0000 mg | PEN_INJECTOR | SUBCUTANEOUS | 4 refills | Status: DC

## 2016-09-24 NOTE — Telephone Encounter (Signed)
New message    Alirocumab (PRALUENT) 150 MG/ML SOPN Inject 150 mg into the skin every 14 (fourteen) days.   They would like you to send a prescription for a 90 day supply for the patient assistance program  Fax [phone]

## 2016-09-24 NOTE — Telephone Encounter (Signed)
rx changed to 3 month supply, faxed to [facility]  [phone]

## 2016-09-24 NOTE — Telephone Encounter (Signed)
Message sent pharmacy.

## 2016-10-04 ENCOUNTER — Telehealth: Payer: Self-pay | Admitting: Pharmacist Clinician (PhC)/ Clinical Pharmacy Specialist

## 2016-10-04 DIAGNOSIS — E785 Hyperlipidemia, unspecified: Secondary | ICD-10-CM

## 2016-10-04 NOTE — Telephone Encounter (Signed)
Patient received first shipment from [employer] for Praluent 75 mg.  Starting LDL 76.  Will have him repeat labs after 6th dose.  Patient to take first dose tomorrow.  Voiced understanding about repeat labs

## 2017-02-14 ENCOUNTER — Telehealth: Payer: Self-pay | Admitting: Pharmacist Clinician (PhC)/ Clinical Pharmacy Specialist

## 2017-02-14 NOTE — Telephone Encounter (Signed)
Patient concerned about problems with nasal drainage that continues regularly since increasing his Praluent from 75 to 150 mg.     Patient states he does not want to stop medication, after 2 more doses he is due to get labs drawn and he wants to see how well it works.  Asked if he could use benadryl prn in the meantime.  Assured him that benadryl is fine, but to be cautious of drowsiness.  Pt is aware, takes only 1/2 tablet per dose and won't use if he is going out.  Will repeat labs in 3 weeks and then determine course of action.

## 2017-03-11 LAB — LIPID PANEL
CHOLESTEROL TOTAL: 126 mg/dL (ref 100–199)
Chol/HDL Ratio: 2.3 ratio (ref 0.0–5.0)
HDL: 55 mg/dL (ref >39)
LDL Calculated: 50 mg/dL (ref 0–99)
TRIGLYCERIDES: 104 mg/dL (ref 0–149)
VLDL Cholesterol Cal: 21 mg/dL (ref 5–40)

## 2017-04-23 ENCOUNTER — Other Ambulatory Visit: Payer: Self-pay | Admitting: Pharmacist Clinician (PhC)/ Clinical Pharmacy Specialist

## 2017-04-23 MED ORDER — EVOLOCUMAB 140 MG/ML ~~LOC~~ SOAJ: 140.0000 mg | SUBCUTANEOUS | 12 refills | Status: DC

## 2017-07-28 ENCOUNTER — Telehealth: Payer: Self-pay | Admitting: Cardiovascular Disease

## 2017-07-28 DIAGNOSIS — I10 Essential (primary) hypertension: Secondary | ICD-10-CM

## 2017-07-28 DIAGNOSIS — Z5181 Encounter for therapeutic drug level monitoring: Secondary | ICD-10-CM

## 2017-07-28 DIAGNOSIS — E7800 Pure hypercholesterolemia, unspecified: Secondary | ICD-10-CM

## 2017-07-28 NOTE — Telephone Encounter (Signed)
Spoke with patient and he would like to see where his lipids are since November. Order placed in Epic for LP/CMET

## 2017-07-28 NOTE — Telephone Encounter (Signed)
New Message:    Pt is scheduled for his yearly check up on 08-01-17.He wants to know if he need lab work before his appt on Friday?

## 2017-07-30 DIAGNOSIS — Z5181 Encounter for therapeutic drug level monitoring: Secondary | ICD-10-CM | POA: Diagnosis not present

## 2017-07-30 DIAGNOSIS — I10 Essential (primary) hypertension: Secondary | ICD-10-CM | POA: Diagnosis not present

## 2017-07-30 DIAGNOSIS — E7800 Pure hypercholesterolemia, unspecified: Secondary | ICD-10-CM | POA: Diagnosis not present

## 2017-07-30 LAB — COMPREHENSIVE METABOLIC PANEL
ALBUMIN: 4.1 g/dL (ref 3.5–4.8)
ALK PHOS: 108 IU/L (ref 39–117)
ALT: 21 IU/L (ref 0–44)
AST: 23 IU/L (ref 0–40)
Albumin/Globulin Ratio: 1.9 (ref 1.2–2.2)
BUN / CREAT RATIO: 16 (ref 10–24)
BUN: 16 mg/dL (ref 8–27)
Bilirubin Total: 0.9 mg/dL (ref 0.0–1.2)
CO2: 27 mmol/L (ref 20–29)
CREATININE: 0.98 mg/dL (ref 0.76–1.27)
Calcium: 9.2 mg/dL (ref 8.6–10.2)
Chloride: 102 mmol/L (ref 96–106)
GFR calc Af Amer: 85 mL/min/{1.73_m2} (ref >59)
GFR calc non Af Amer: 74 mL/min/{1.73_m2} (ref >59)
Globulin, Total: 2.2 g/dL (ref 1.5–4.5)
Glucose: 104 mg/dL — ABNORMAL HIGH (ref 65–99)
Potassium: 4.6 mmol/L (ref 3.5–5.2)
SODIUM: 142 mmol/L (ref 134–144)
Total Protein: 6.3 g/dL (ref 6.0–8.5)

## 2017-07-30 LAB — LIPID PANEL
CHOLESTEROL TOTAL: 126 mg/dL (ref 100–199)
Chol/HDL Ratio: 2.7 ratio (ref 0.0–5.0)
HDL: 46 mg/dL (ref >39)
LDL Calculated: 57 mg/dL (ref 0–99)
TRIGLYCERIDES: 115 mg/dL (ref 0–149)
VLDL CHOLESTEROL CAL: 23 mg/dL (ref 5–40)

## 2017-08-01 ENCOUNTER — Encounter: Payer: Self-pay | Admitting: Cardiovascular Disease

## 2017-08-01 ENCOUNTER — Ambulatory Visit: Payer: Medicare HMO | Admitting: Cardiovascular Disease

## 2017-08-01 VITALS — BP 138/64 | HR 60 | Ht 67.0 in | Wt 143.0 lb

## 2017-08-01 DIAGNOSIS — I10 Essential (primary) hypertension: Secondary | ICD-10-CM | POA: Diagnosis not present

## 2017-08-01 DIAGNOSIS — E7800 Pure hypercholesterolemia, unspecified: Secondary | ICD-10-CM | POA: Diagnosis not present

## 2017-08-01 DIAGNOSIS — Z5181 Encounter for therapeutic drug level monitoring: Secondary | ICD-10-CM

## 2017-08-01 MED ORDER — LISINOPRIL 40 MG PO TABS: 40.0000 mg | ORAL_TABLET | Freq: Every day | ORAL | 3 refills | Status: DC

## 2017-08-01 NOTE — Patient Instructions (Signed)
Medication Instructions:  Stop: Losartan 50 Mg  Start: Lisinopril 40 mg daily  Labwork: Your physician recommends that you return for lab work in: 1 week (BMP)     Follow-Up: Your physician recommends that you schedule a follow-up appointment in: 42 month with Pharmacist  Your physician wants you to follow-up in: 41 month with Dr. Pat Kocher will receive a reminder letter in the mail two months in advance. If you don't receive a letter, please call our office to schedule the follow-up appointment.     Any Other Special Instructions Will Be Listed Below (If Applicable).     If you need a refill on your cardiac medications before your next appointment, please call your pharmacy.

## 2017-08-01 NOTE — Progress Notes (Signed)
Cardiology Office Note   Date:  08/01/2017   ID:  Jeremy Church, DOB January 07, 1941, MRN [id number]  PCP:  Haywood Pao, MD  Cardiologist:   Skeet Latch, MD   Chief Complaint  Patient presents with  . Follow-up    1 year.      History of Present Illness: Jeremy Church is a 77 y.o. male with hypertension, hyperlipidemia and prior stroke who presents for follow up.   He was initially seen 07/2016 for management of hyperlipidemia. He has previously failed atorvastatin, pravastatin and rosuvastatin .  He either failed to reach target or developed side effects.  At his last appointment he was doing well.  He was intermittently taking the Praluent due to cost.  He saw our pharmacist and was enrolled in an assistance program.  LDL was last checked 07/2017 and was 57.  At his last appointment his blood pressure was above goal.  However he reported that it has been well-controlled at home.  Since his last appointment Jeremy Church has been doing well.  He has been exercising by helping his son to build a home.  He hopes to start back exercising at [employer].  He hasn't noted any chest pain or shortness of breath.  He initially struggled with balance after his stroke.  This is now back to baseline. He had to stop Praluent due to runny nose.  He switched to [place] and is doing well.  His BP at home has been ranging from the 120s-150s/70-90s.  He has no chest pain, shortness of breath, lower extremity edema, orthopnea or PND.    Past Medical History:  Diagnosis Date  . High cholesterol   . Hypertension   . Prostate neoplasm     Past Surgical History:  Procedure Laterality Date  . TRANSURETHRAL RESECTION OF PROSTATE  10/15/2011   Procedure: TRANSURETHRAL RESECTION OF THE PROSTATE (TURP);  Surgeon: Fredricka Bonine, MD;  Location: [facility];  Service: Urology;  Laterality: N/A;  CYSTO AND GYRUS NEEDED     Current Outpatient Medications  Medication Sig  Dispense Refill  . Cholecalciferol (VITAMIN D PO) Take 5,000 Units by mouth daily.    . clopidogrel (PLAVIX) 75 MG tablet Take 1 tablet (75 mg total) by mouth daily. 90 tablet 5  . Evolocumab (REPATHA SURECLICK) 656 MG/ML SOAJ Inject 140 mg into the skin every 14 (fourteen) days. 2 pen 12  . losartan (COZAAR) 50 MG tablet Take 50 mg by mouth daily.     No current facility-administered medications for this visit.     Allergies:   Lipitor [atorvastatin]    Social History:  The patient  reports that he has never smoked. He has never used smokeless tobacco. He reports that he drinks about 2.4 oz of alcohol per week. He reports that he does not use drugs.   Family History:  [name] family history includes Heart attack in his brother and mother; Heart failure in his sister; Hypertension in his father and mother; Stroke in his father.    ROS:  Please see the history of present illness.   Otherwise, review of systems are positive for none.   All other systems are reviewed and negative.    PHYSICAL EXAM: VS:  BP 138/64 (BP Location: Left Arm, Patient Position: Sitting, Cuff Size: Normal)   Pulse 60   Ht 5\' 7"  (1.702 m)   Wt 143 lb (64.9 kg)   BMI 22.40 kg/m  , BMI Body mass index  is 22.4 kg/m. GENERAL:  Well appearing.  No acute stress HEENT: Pupils equal round and reactive, fundi not visualized, oral mucosa unremarkable NECK:  No jugular venous distention, waveform within normal limits, carotid upstroke brisk and symmetric, no bruits LUNGS:  Clear to auscultation bilaterally HEART:  RRR.  PMI not displaced or sustained,S1 and S2 within normal limits, no S3, no S4, no clicks, no rubs, I/VI systolic murmur at the LLSB ABD:  Flat, positive bowel sounds normal in frequency in pitch, no bruits, no rebound, no guarding, no midline pulsatile mass, no hepatomegaly, no splenomegaly EXT:  2 plus pulses throughout, no edema, no cyanosis no clubbing SKIN:  No rashes no nodules NEURO:  Cranial  nerves II through XII grossly intact, motor grossly intact throughout PSYCH:  Cognitively intact, oriented to person place and time   EKG:  EKG is ordered today. The ekg ordered for/12/18 demonstrates sinus rhythm. Rate 61 bpm. Left anterior fascicular block. 08/01/17: Sinus rhtyhm.  Rate 60 bpm.  One PVC.     Recent Labs: 07/30/2017: ALT 21; BUN 16; Creatinine, Ser 0.98; Potassium 4.6; Sodium 142   06/03/16: Sodium 139, potassium 4.5, BUN 18, creatinine 0.9 WBC 4.38, hemoglobin 16.4, hematocrit 51, platelets 144 Total cholesterol 151, triglycerides 139, HDL 47, LDL 76  Lipid Panel    Component Value Date/Time   CHOL 126 07/30/2017 0827   TRIG 115 07/30/2017 0827   HDL 46 07/30/2017 0827   CHOLHDL 2.7 07/30/2017 0827   LDLCALC 57 07/30/2017 0827      Wt Readings from Last 3 Encounters:  08/01/17 143 lb (64.9 kg)  08/01/16 153 lb (69.4 kg)  06/04/16 152 lb 6.4 oz (69.1 kg)      ASSESSMENT AND PLAN:  # Hyperlipidemia:  LDL 57 on Repatha.  No changes.  # Hypertension:  Blood pressure is above goal both initially and on repeat. It has been elevated at home.  Stop losartan and start lisinopril 40mg  daily.   Check BMP in 1 week.  # Prior stroke: Continue clopidogrel and Repatha.   Current medicines are reviewed at length with the patient today.  The patient does not have concerns regarding medicines.  The following changes have been made:  Switch losartan to lisinopril. Labs/ tests ordered today include:  No orders of the defined types were placed in this encounter.   Disposition:   FU with Quincy Boy C. Oval Linsey, MD, [facility] in 1 year.  PharmD for BP control in 1 month.    Signed, Keelie Zemanek C. Oval Linsey, MD, [facility]  08/01/2017 9:28 AM    [facility]

## 2017-08-09 ENCOUNTER — Encounter: Payer: Self-pay | Admitting: Cardiovascular Disease

## 2017-08-11 DIAGNOSIS — I10 Essential (primary) hypertension: Secondary | ICD-10-CM | POA: Diagnosis not present

## 2017-08-11 DIAGNOSIS — Z5181 Encounter for therapeutic drug level monitoring: Secondary | ICD-10-CM | POA: Diagnosis not present

## 2017-08-12 LAB — BASIC METABOLIC PANEL
BUN / CREAT RATIO: 19 (ref 10–24)
BUN: 21 mg/dL (ref 8–27)
CHLORIDE: 102 mmol/L (ref 96–106)
CO2: 25 mmol/L (ref 20–29)
Calcium: 9.6 mg/dL (ref 8.6–10.2)
Creatinine, Ser: 1.1 mg/dL (ref 0.76–1.27)
GFR calc Af Amer: 74 mL/min/{1.73_m2} (ref >59)
GFR calc non Af Amer: 64 mL/min/{1.73_m2} (ref >59)
Glucose: 105 mg/dL — ABNORMAL HIGH (ref 65–99)
POTASSIUM: 4.9 mmol/L (ref 3.5–5.2)
SODIUM: 143 mmol/L (ref 134–144)

## 2017-09-02 ENCOUNTER — Ambulatory Visit (INDEPENDENT_AMBULATORY_CARE_PROVIDER_SITE_OTHER): Payer: Medicare HMO | Admitting: Pharmacist

## 2017-09-02 VITALS — BP 118/68 | HR 54 | Ht 67.0 in

## 2017-09-02 DIAGNOSIS — I10 Essential (primary) hypertension: Secondary | ICD-10-CM

## 2017-09-02 NOTE — Patient Instructions (Addendum)
Return for a follow up appointment as needed  Check your blood pressure at home daily (if able) and keep record of the readings.  Take your BP meds as follows: *CONTINUE all medication without changes*  Bring all of your meds, your BP cuff and your record of home blood pressures to your next appointment.  Exercise as you're able, try to walk approximately 30 minutes per day.  Keep salt intake to a minimum, especially watch canned and prepared boxed foods.  Eat more fresh fruits and vegetables and fewer canned items.  Avoid eating in fast food restaurants.    HOW TO TAKE YOUR BLOOD PRESSURE: . Rest 5 minutes before taking your blood pressure. .  Don't smoke or drink caffeinated beverages for at least 30 minutes before. . Take your blood pressure before (not after) you eat. . Sit comfortably with your back supported and both feet on the floor (don't cross your legs). . Elevate your arm to heart level on a table or a desk. . Use the proper sized cuff. It should fit smoothly and snugly around your bare upper arm. There should be enough room to slip a fingertip under the cuff. The bottom edge of the cuff should be 1 inch above the crease of the elbow. . Ideally, take 3 measurements at one sitting and record the average.

## 2017-09-02 NOTE — Progress Notes (Signed)
Patient ID: Jeremy Church                 DOB: [birth year]                      MRN: [id number]     HPI: Jeremy Church is a 77 y.o. male referred by Dr. Oval Linsey to HTN clinic. PMH includes hypertension, hyperlipidemia, and piror stroke. Patient is currently on Repatha for lipid management. During most recent OV with DR Oval Linsey his blood preasure was slightly above goal and losartan therapy was changed to lisinopril 40mg  daily. Follow up BMET shows stable renal function with Scr at 1.1 and electrolytes within normal limits.  Current HTN meds: lisinopril 40mg  daily  BP goal: 130/80  Family History: family history includes Heart attack in his brother and mother; Heart failure in his sister; Hypertension in his father and mother; Stroke in his father.   Social History: patient  reports that he has never smoked. He has never used smokeless tobacco. He reports that he drinks about 2.4 oz of alcohol per week. He reports that he does not use drugs  Diet: Mostly home cooked meals, low sodium, frozen veggies, not canned, low carbohydrates (no pasta or white bread)  Exercise: Goes to gym 4-5 days per week, treadmill for 30 minutes, then resistance/strength, especially abdominal exercises  Home BP readings: none readings available today (he forgot records at home) "Consistently below 130s"  Wt Readings from Last 3 Encounters:  08/01/17 143 lb (64.9 kg)  08/01/16 153 lb (69.4 kg)  06/04/16 152 lb 6.4 oz (69.1 kg)   BP Readings from Last 3 Encounters:  09/02/17 118/68  08/01/17 138/64  08/01/16 140/62   Pulse Readings from Last 3 Encounters:  09/02/17 (!) 54  08/01/17 60  08/01/16 61    Past Medical History:  Diagnosis Date  . High cholesterol   . Hypertension   . Prostate neoplasm     Current Outpatient Medications on File Prior to Visit  Medication Sig Dispense Refill  . Cholecalciferol (VITAMIN D PO) Take 5,000 Units by mouth daily.    . clopidogrel (PLAVIX) 75 MG tablet Take 1  tablet (75 mg total) by mouth daily. 90 tablet 5  . Evolocumab (REPATHA SURECLICK) 884 MG/ML SOAJ Inject 140 mg into the skin every 14 (fourteen) days. 2 pen 12  . lisinopril (PRINIVIL,ZESTRIL) 40 MG tablet Take 1 tablet (40 mg total) by mouth daily. 90 tablet 3   No current facility-administered medications on file prior to visit.     Allergies  Allergen Reactions  . Lipitor [Atorvastatin] Other (See Comments)    Muscle pain and weakness    Blood pressure 118/68, pulse (!) 54, height 5\' 7"  (1.702 m), SpO2 98 %.  HTN (hypertension) Blood pressure at goal today and at home . Patient denies problems with current therapy or ADRs. Will continue all current medication without changes and follow up as needed.    Jowell Bossi Rodriguez-Guzman PharmD, BCPS, [place] [address] [place] [id number] 09/04/2017 4:12 PM

## 2017-09-04 ENCOUNTER — Encounter: Payer: Self-pay | Admitting: Pharmacist

## 2017-09-04 NOTE — Assessment & Plan Note (Signed)
Blood pressure at goal today and at home . Patient denies problems with current therapy or ADRs. Will continue all current medication without changes and follow up as needed.

## 2017-10-09 DIAGNOSIS — H21233 Degeneration of iris (pigmentary), bilateral: Secondary | ICD-10-CM | POA: Diagnosis not present

## 2017-10-09 DIAGNOSIS — H25013 Cortical age-related cataract, bilateral: Secondary | ICD-10-CM | POA: Diagnosis not present

## 2017-10-09 DIAGNOSIS — H2513 Age-related nuclear cataract, bilateral: Secondary | ICD-10-CM | POA: Diagnosis not present

## 2017-10-09 DIAGNOSIS — H401131 Primary open-angle glaucoma, bilateral, mild stage: Secondary | ICD-10-CM | POA: Diagnosis not present

## 2017-10-09 DIAGNOSIS — H35032 Hypertensive retinopathy, left eye: Secondary | ICD-10-CM | POA: Diagnosis not present

## 2017-11-10 DIAGNOSIS — J019 Acute sinusitis, unspecified: Secondary | ICD-10-CM | POA: Diagnosis not present

## 2017-11-26 ENCOUNTER — Telehealth: Payer: Self-pay | Admitting: Pharmacist Clinician (PhC)/ Clinical Pharmacy Specialist

## 2017-11-26 NOTE — Telephone Encounter (Signed)
Patient hit coverage gap on insurance, copay for [place] jumped to $376/month.  He will download and fill out safety net information and bring to [place] for submission.

## 2017-12-10 ENCOUNTER — Encounter: Payer: Self-pay | Admitting: Cardiovascular Disease

## 2017-12-10 DIAGNOSIS — M859 Disorder of bone density and structure, unspecified: Secondary | ICD-10-CM | POA: Diagnosis not present

## 2017-12-10 DIAGNOSIS — Z125 Encounter for screening for malignant neoplasm of prostate: Secondary | ICD-10-CM | POA: Diagnosis not present

## 2017-12-10 DIAGNOSIS — I10 Essential (primary) hypertension: Secondary | ICD-10-CM | POA: Diagnosis not present

## 2017-12-10 DIAGNOSIS — E7800 Pure hypercholesterolemia, unspecified: Secondary | ICD-10-CM | POA: Diagnosis not present

## 2017-12-17 DIAGNOSIS — Z0000 Encounter for general adult medical examination without abnormal findings: Secondary | ICD-10-CM | POA: Diagnosis not present

## 2017-12-17 DIAGNOSIS — I7774 Dissection of vertebral artery: Secondary | ICD-10-CM | POA: Diagnosis not present

## 2017-12-17 DIAGNOSIS — R413 Other amnesia: Secondary | ICD-10-CM | POA: Diagnosis not present

## 2017-12-17 DIAGNOSIS — M859 Disorder of bone density and structure, unspecified: Secondary | ICD-10-CM | POA: Diagnosis not present

## 2017-12-17 DIAGNOSIS — I69959 Hemiplegia and hemiparesis following unspecified cerebrovascular disease affecting unspecified side: Secondary | ICD-10-CM | POA: Diagnosis not present

## 2017-12-17 DIAGNOSIS — Z23 Encounter for immunization: Secondary | ICD-10-CM | POA: Diagnosis not present

## 2017-12-17 DIAGNOSIS — I10 Essential (primary) hypertension: Secondary | ICD-10-CM | POA: Diagnosis not present

## 2017-12-17 DIAGNOSIS — I6789 Other cerebrovascular disease: Secondary | ICD-10-CM | POA: Diagnosis not present

## 2017-12-17 DIAGNOSIS — N400 Enlarged prostate without lower urinary tract symptoms: Secondary | ICD-10-CM | POA: Diagnosis not present

## 2017-12-17 DIAGNOSIS — D692 Other nonthrombocytopenic purpura: Secondary | ICD-10-CM | POA: Diagnosis not present

## 2017-12-17 DIAGNOSIS — E7800 Pure hypercholesterolemia, unspecified: Secondary | ICD-10-CM | POA: Diagnosis not present

## 2017-12-18 ENCOUNTER — Other Ambulatory Visit (HOSPITAL_COMMUNITY): Payer: Self-pay | Admitting: Internal Medicine

## 2017-12-18 ENCOUNTER — Ambulatory Visit (HOSPITAL_COMMUNITY)
Admission: RE | Admit: 2017-12-18 | Discharge: 2017-12-18 | Disposition: A | Discharge status: Home / Self Care | Payer: Medicare HMO | Source: Ambulatory Visit | Attending: Family | Admitting: Family

## 2017-12-18 DIAGNOSIS — I679 Cerebrovascular disease, unspecified: Secondary | ICD-10-CM

## 2017-12-18 DIAGNOSIS — E785 Hyperlipidemia, unspecified: Secondary | ICD-10-CM | POA: Diagnosis not present

## 2017-12-18 DIAGNOSIS — Z8673 Personal history of transient ischemic attack (TIA), and cerebral infarction without residual deficits: Secondary | ICD-10-CM | POA: Diagnosis not present

## 2017-12-18 DIAGNOSIS — I10 Essential (primary) hypertension: Secondary | ICD-10-CM | POA: Diagnosis not present

## 2017-12-18 DIAGNOSIS — I63112 Cerebral infarction due to embolism of left vertebral artery: Secondary | ICD-10-CM | POA: Diagnosis not present

## 2017-12-18 DIAGNOSIS — I6523 Occlusion and stenosis of bilateral carotid arteries: Secondary | ICD-10-CM | POA: Diagnosis not present

## 2017-12-19 DIAGNOSIS — Z1212 Encounter for screening for malignant neoplasm of rectum: Secondary | ICD-10-CM | POA: Diagnosis not present

## 2017-12-31 DIAGNOSIS — M859 Disorder of bone density and structure, unspecified: Secondary | ICD-10-CM | POA: Diagnosis not present

## 2018-01-21 ENCOUNTER — Telehealth: Payer: Self-pay | Admitting: Pharmacist Clinician (PhC)/ Clinical Pharmacy Specialist

## 2018-01-21 NOTE — Telephone Encounter (Signed)
Patient in donut hole, copay is $284 per month.  Has paid for 2 months, offered to give him samples in November to help with cost.  He does not qualify for Safety Net.     Will also have patient repeat labs in Nov/Dec as authorization renewal will need to be done on Jan 1.

## 2018-01-22 DIAGNOSIS — D485 Neoplasm of uncertain behavior of skin: Secondary | ICD-10-CM | POA: Diagnosis not present

## 2018-01-22 DIAGNOSIS — L820 Inflamed seborrheic keratosis: Secondary | ICD-10-CM | POA: Diagnosis not present

## 2018-01-22 DIAGNOSIS — Z23 Encounter for immunization: Secondary | ICD-10-CM | POA: Diagnosis not present

## 2018-02-02 ENCOUNTER — Other Ambulatory Visit: Payer: Self-pay | Admitting: Physician Assistant

## 2018-02-02 DIAGNOSIS — R634 Abnormal weight loss: Secondary | ICD-10-CM | POA: Diagnosis not present

## 2018-02-02 DIAGNOSIS — R1030 Lower abdominal pain, unspecified: Secondary | ICD-10-CM | POA: Diagnosis not present

## 2018-02-02 DIAGNOSIS — K5900 Constipation, unspecified: Secondary | ICD-10-CM | POA: Diagnosis not present

## 2018-02-02 DIAGNOSIS — Z1211 Encounter for screening for malignant neoplasm of colon: Secondary | ICD-10-CM | POA: Diagnosis not present

## 2018-02-06 ENCOUNTER — Ambulatory Visit
Admission: RE | Admit: 2018-02-06 | Discharge: 2018-02-06 | Disposition: A | Discharge status: Home / Self Care | Payer: Medicare HMO | Source: Ambulatory Visit | Attending: Physician Assistant | Admitting: Physician Assistant

## 2018-02-06 DIAGNOSIS — R634 Abnormal weight loss: Secondary | ICD-10-CM

## 2018-02-06 DIAGNOSIS — K5730 Diverticulosis of large intestine without perforation or abscess without bleeding: Secondary | ICD-10-CM | POA: Diagnosis not present

## 2018-02-06 DIAGNOSIS — K7689 Other specified diseases of liver: Secondary | ICD-10-CM | POA: Diagnosis not present

## 2018-02-06 DIAGNOSIS — R1030 Lower abdominal pain, unspecified: Secondary | ICD-10-CM

## 2018-02-06 DIAGNOSIS — N281 Cyst of kidney, acquired: Secondary | ICD-10-CM | POA: Diagnosis not present

## 2018-02-06 IMAGING — CT CT ABD-PELV W/ CM
2 of 5 series · 16 of 46 positions shown, 18 images · IV contrast (iopamidol)
Comparison: [DATE]

CLINICAL DATA: Left side lower abdomen pain no fever symptoms
resolve when do not beef mostly Since [REDACTED] No surgeries No history
cancer.

EXAM:
CT ABDOMEN AND PELVIS WITH CONTRAST
TECHNIQUE: Multidetector CT imaging of the abdomen and pelvis was performed
using the standard protocol following bolus administration of
intravenous contrast.
CONTRAST:  100mL [ZI] IOPAMIDOL ([ZI]) INJECTION 61%

[Series 2: abd pelvis 5.00 br40 s3 ax · axial · 0.69mm/px · z∈[+1145,+1505]mm · 13 of 81 slices shown, 15 images]
[im 5/81  soft-tissue]
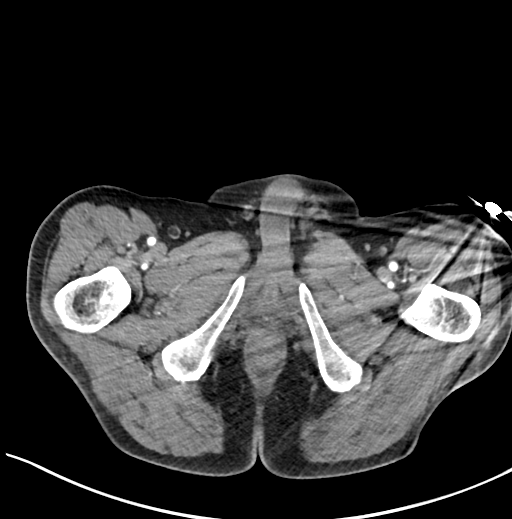
[im 5/81  bone]
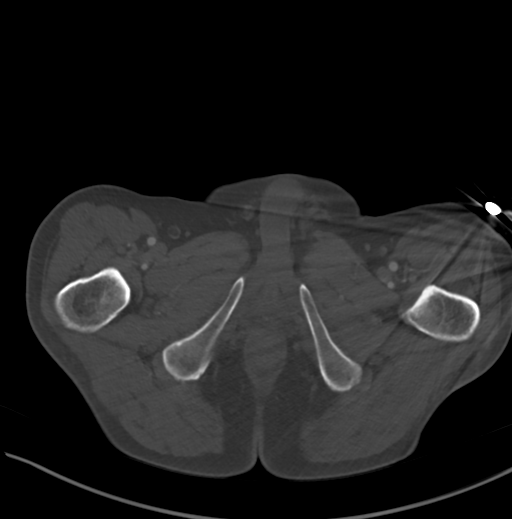
[im 13/81  soft-tissue]
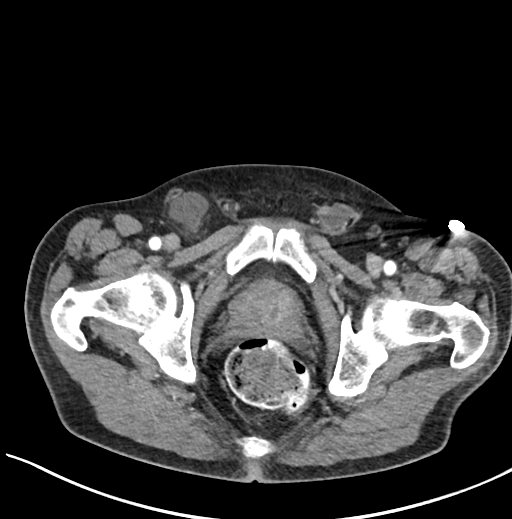
[im 17/81  soft-tissue]
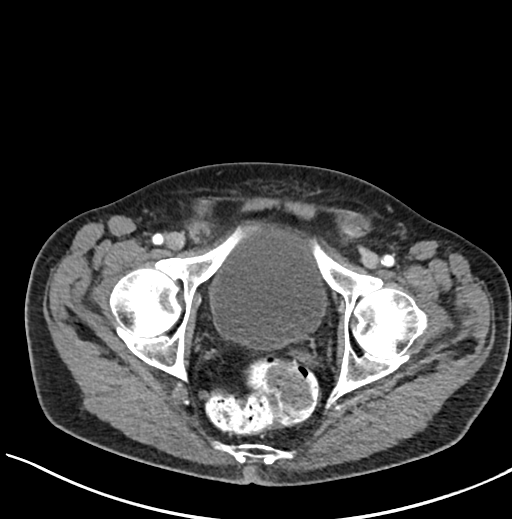
[im 25/81  soft-tissue]
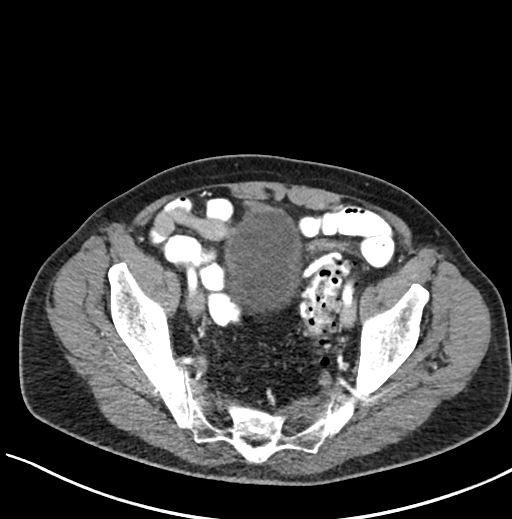
[im 29/81  soft-tissue]
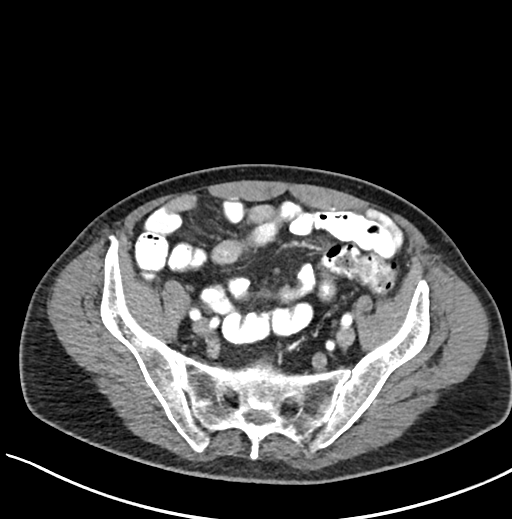
[im 37/81  soft-tissue]
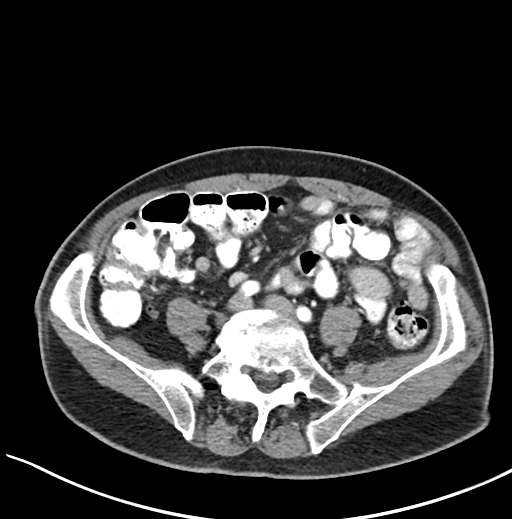
[im 41/81  soft-tissue]
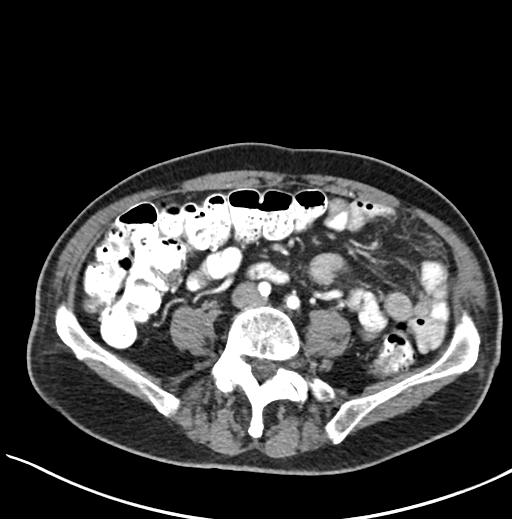
[im 45/81  soft-tissue]
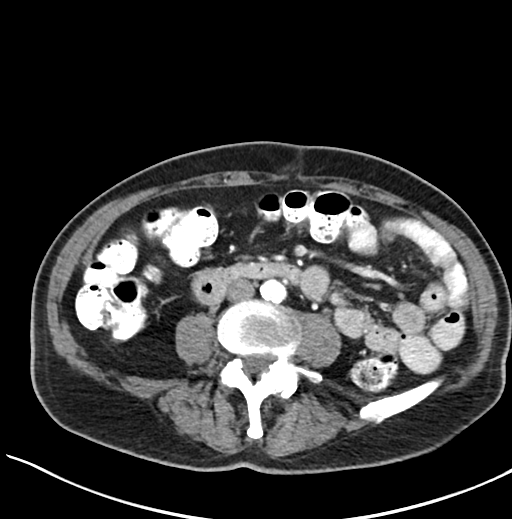
[im 53/81  soft-tissue]
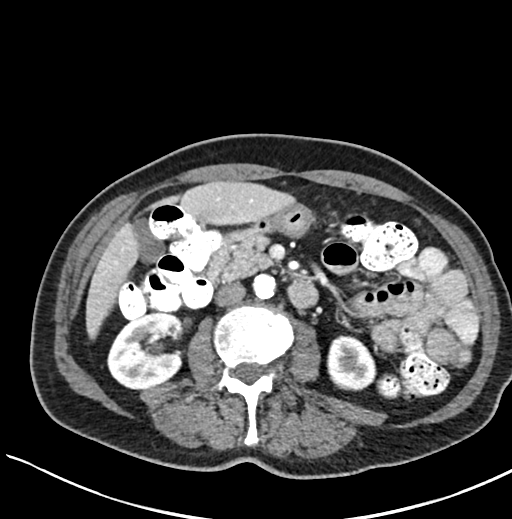
[im 53/81  bone]
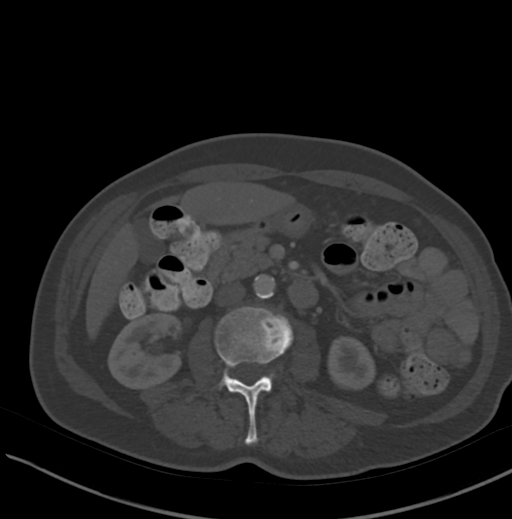
[im 57/81  soft-tissue]
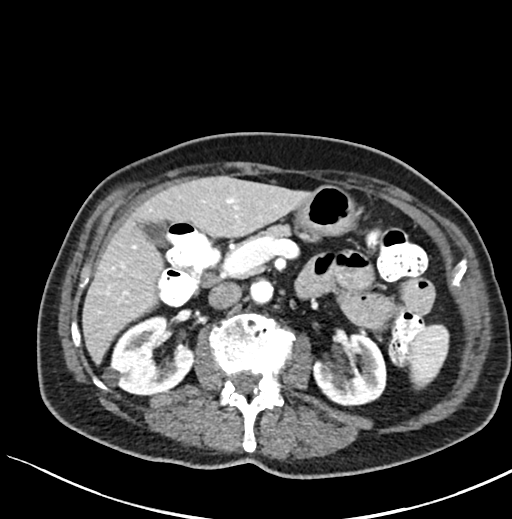
[im 65/81  soft-tissue]
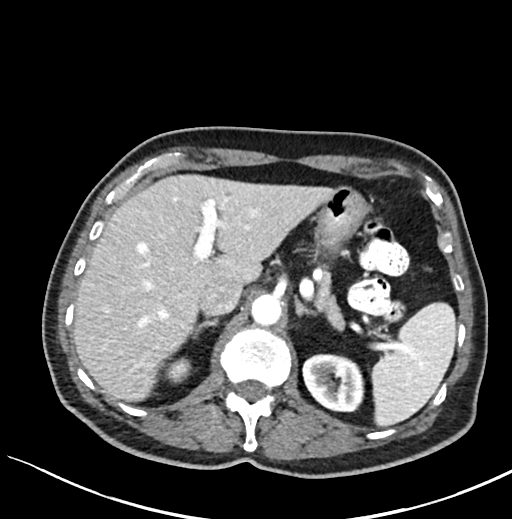
[im 69/81  soft-tissue]
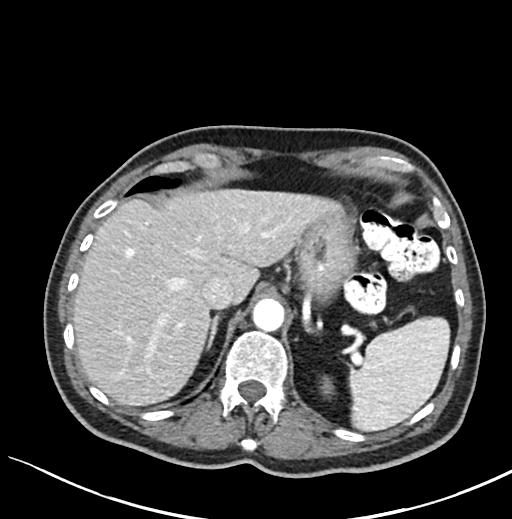
[im 77/81  soft-tissue]
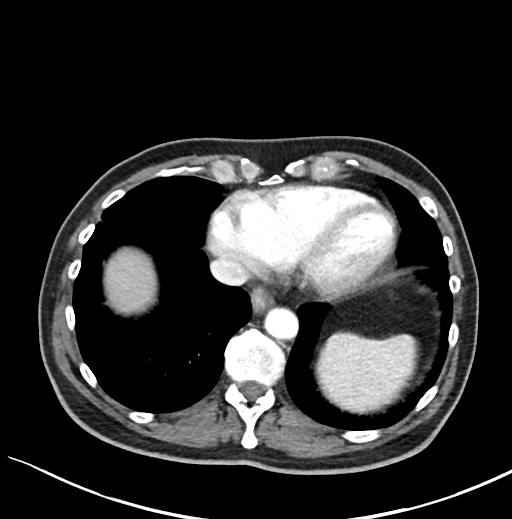

[Series 6: abd pelvis 2.00 br40 s3 cor · coronal · 0.67mm/px · 3 of 150 slices shown]
[im 50/150  soft-tissue]
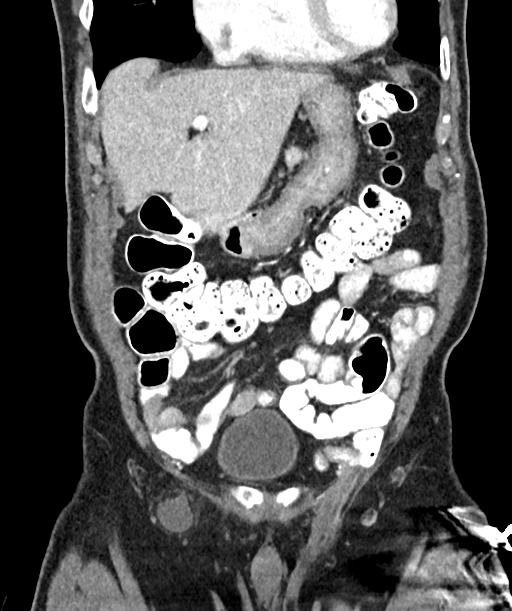
[im 67/150  soft-tissue]
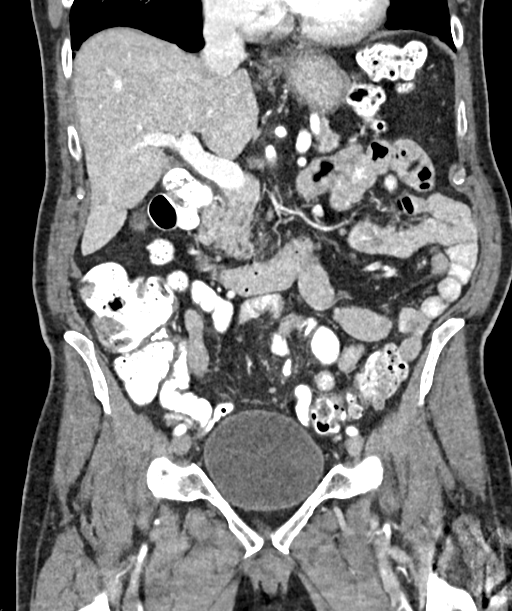
[im 83/150  soft-tissue]
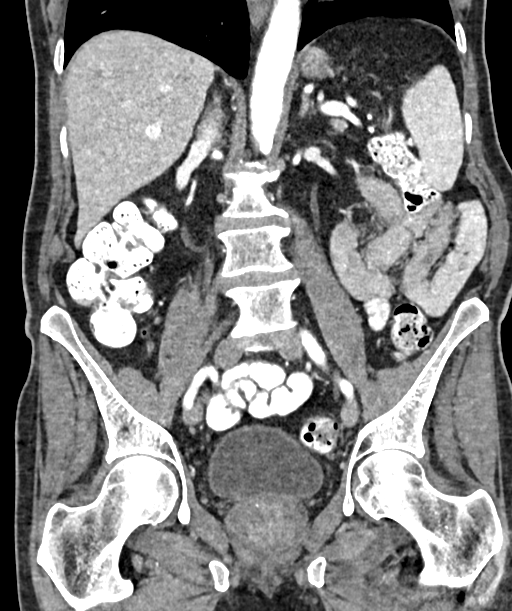

[16 of 46 positions shown; findings below may reference images not displayed]

FINDINGS: Lower chest: Small hiatal hernia. Lung bases clear. No pleural or
pericardial effusion.

Hepatobiliary: Stable probable cyst in hepatic segment 7. No new
liver lesion. Gallbladder is nondistended. No biliary ductal
dilatation.

Pancreas: Unremarkable. No pancreatic ductal dilatation or
surrounding inflammatory changes.

Spleen: Normal in size without focal abnormality.

Adrenals/Urinary Tract: Normal adrenals. Stable 1.3 cm mid pole
right renal cyst. No urolithiasis. No hydronephrosis. Urinary
bladder is physiologically distended.

Stomach/Bowel: Small hiatal hernia. Stomach is nondistended. Small
bowel decompressed. Normal appendix. Moderate proximal colonic and
rectal fecal material. Multiple sigmoid diverticula without
significant adjacent inflammatory/edematous change or abscess.

Vascular/Lymphatic: Scattered aortoiliac calcified plaque without
aneurysm or stenosis. No abdominal or pelvic adenopathy.

Reproductive: Moderate prostatic enlargement with central coarse
calcifications.

Other: Fluid in the proximal left inguinal canal. No abdominal
ascites. Stable subcutaneous fluid collection in the right groin. No
free air.

Musculoskeletal: Mild lumbar dextroscoliosis with multilevel
spondylitic change. Negative for fracture or worrisome bone lesion.
IMPRESSION: 1. No acute findings.
2. Sigmoid diverticulosis
3. Small hiatal hernia.
4. Small stable hepatic and renal cysts.
5.

## 2018-02-06 MED ORDER — IOPAMIDOL (ISOVUE-300) INJECTION 61%
100.0000 mL | Freq: Once | INTRAVENOUS | Status: AC | PRN
  Administered 2018-02-06: 100 mL via INTRAVENOUS

## 2018-02-17 ENCOUNTER — Telehealth: Payer: Self-pay | Admitting: Pharmacist Clinician (PhC)/ Clinical Pharmacy Specialist

## 2018-02-17 ENCOUNTER — Other Ambulatory Visit: Payer: Self-pay | Admitting: Pharmacist Clinician (PhC)/ Clinical Pharmacy Specialist

## 2018-02-17 DIAGNOSIS — E7800 Pure hypercholesterolemia, unspecified: Secondary | ICD-10-CM

## 2018-02-17 NOTE — Telephone Encounter (Signed)
Lab orders for Repatha renewal 2020

## 2018-03-02 DIAGNOSIS — Z1211 Encounter for screening for malignant neoplasm of colon: Secondary | ICD-10-CM | POA: Diagnosis not present

## 2018-03-02 DIAGNOSIS — K5900 Constipation, unspecified: Secondary | ICD-10-CM | POA: Diagnosis not present

## 2018-03-05 ENCOUNTER — Other Ambulatory Visit: Payer: Self-pay | Admitting: Cardiovascular Disease

## 2018-03-05 DIAGNOSIS — E7800 Pure hypercholesterolemia, unspecified: Secondary | ICD-10-CM | POA: Diagnosis not present

## 2018-03-05 LAB — LIPID PANEL
CHOLESTEROL TOTAL: 98 mg/dL — AB (ref 100–199)
Chol/HDL Ratio: 2 ratio (ref 0.0–5.0)
HDL: 50 mg/dL (ref >39)
LDL Calculated: 31 mg/dL (ref 0–99)
TRIGLYCERIDES: 86 mg/dL (ref 0–149)
VLDL Cholesterol Cal: 17 mg/dL (ref 5–40)

## 2018-04-07 DIAGNOSIS — H401131 Primary open-angle glaucoma, bilateral, mild stage: Secondary | ICD-10-CM | POA: Diagnosis not present

## 2018-04-07 DIAGNOSIS — H1013 Acute atopic conjunctivitis, bilateral: Secondary | ICD-10-CM | POA: Diagnosis not present

## 2018-04-07 DIAGNOSIS — Z91018 Allergy to other foods: Secondary | ICD-10-CM | POA: Diagnosis not present

## 2018-04-28 ENCOUNTER — Other Ambulatory Visit: Payer: Self-pay

## 2018-04-28 MED ORDER — EVOLOCUMAB 140 MG/ML ~~LOC~~ SOAJ: 140.0000 mg | SUBCUTANEOUS | 12 refills | Status: DC

## 2018-06-20 ENCOUNTER — Other Ambulatory Visit: Payer: Self-pay | Admitting: Cardiovascular Disease

## 2018-08-13 ENCOUNTER — Other Ambulatory Visit: Payer: Self-pay

## 2018-08-13 MED ORDER — EVOLOCUMAB 140 MG/ML ~~LOC~~ SOAJ: 140.0000 mg | SUBCUTANEOUS | 12 refills | Status: DC

## 2018-08-14 ENCOUNTER — Other Ambulatory Visit: Payer: Self-pay

## 2018-08-14 MED ORDER — EVOLOCUMAB 140 MG/ML ~~LOC~~ SOAJ: 140.0000 mg | SUBCUTANEOUS | 12 refills | Status: DC

## 2018-09-02 ENCOUNTER — Other Ambulatory Visit: Payer: Self-pay

## 2018-09-02 DIAGNOSIS — I10 Essential (primary) hypertension: Secondary | ICD-10-CM | POA: Diagnosis not present

## 2018-09-02 DIAGNOSIS — E7800 Pure hypercholesterolemia, unspecified: Secondary | ICD-10-CM

## 2018-09-02 LAB — COMPREHENSIVE METABOLIC PANEL
ALT: 13 IU/L (ref 0–44)
AST: 18 IU/L (ref 0–40)
Albumin/Globulin Ratio: 2 (ref 1.2–2.2)
Albumin: 4.3 g/dL (ref 3.7–4.7)
Alkaline Phosphatase: 134 IU/L — ABNORMAL HIGH (ref 39–117)
BUN/Creatinine Ratio: 16 (ref 10–24)
BUN: 13 mg/dL (ref 8–27)
Bilirubin Total: 1 mg/dL (ref 0.0–1.2)
CO2: 23 mmol/L (ref 20–29)
Calcium: 9.1 mg/dL (ref 8.6–10.2)
Chloride: 102 mmol/L (ref 96–106)
Creatinine, Ser: 0.81 mg/dL (ref 0.76–1.27)
GFR calc Af Amer: 98 mL/min/{1.73_m2} (ref >59)
GFR calc non Af Amer: 85 mL/min/{1.73_m2} (ref >59)
Globulin, Total: 2.2 g/dL (ref 1.5–4.5)
Glucose: 96 mg/dL (ref 65–99)
Potassium: 4.3 mmol/L (ref 3.5–5.2)
Sodium: 141 mmol/L (ref 134–144)
Total Protein: 6.5 g/dL (ref 6.0–8.5)

## 2018-09-02 LAB — LIPID PANEL
Chol/HDL Ratio: 2 ratio (ref 0.0–5.0)
Cholesterol, Total: 102 mg/dL (ref 100–199)
HDL: 50 mg/dL (ref >39)
LDL Calculated: 35 mg/dL (ref 0–99)
Triglycerides: 87 mg/dL (ref 0–149)
VLDL Cholesterol Cal: 17 mg/dL (ref 5–40)

## 2018-09-09 ENCOUNTER — Telehealth (INDEPENDENT_AMBULATORY_CARE_PROVIDER_SITE_OTHER): Payer: Medicare HMO | Admitting: Cardiovascular Disease

## 2018-09-09 ENCOUNTER — Encounter: Payer: Self-pay | Admitting: Cardiovascular Disease

## 2018-09-09 VITALS — BP 123/68 | HR 68 | Ht 67.5 in | Wt 136.0 lb

## 2018-09-09 DIAGNOSIS — I63112 Cerebral infarction due to embolism of left vertebral artery: Secondary | ICD-10-CM | POA: Diagnosis not present

## 2018-09-09 DIAGNOSIS — I6523 Occlusion and stenosis of bilateral carotid arteries: Secondary | ICD-10-CM

## 2018-09-09 DIAGNOSIS — I10 Essential (primary) hypertension: Secondary | ICD-10-CM | POA: Diagnosis not present

## 2018-09-09 DIAGNOSIS — Z91018 Allergy to other foods: Secondary | ICD-10-CM

## 2018-09-09 DIAGNOSIS — E7800 Pure hypercholesterolemia, unspecified: Secondary | ICD-10-CM

## 2018-09-09 HISTORY — DX: Allergy to other foods: Z91.018

## 2018-09-09 NOTE — Progress Notes (Signed)
Virtual Visit via Video Note   This visit type was conducted due to national recommendations for restrictions regarding the COVID-19 Pandemic (e.g. social distancing) in an effort to limit this patient's exposure and mitigate transmission in our community.  Due to his co-morbid illnesses, this patient is at least at moderate risk for complications without adequate follow up.  This format is felt to be most appropriate for this patient at this time.  All issues noted in this document were discussed and addressed.  A limited physical exam was performed with this format.  Please refer to the patient's chart for his consent to telehealth for [facility].   Date:  09/09/2018   ID:  Jeremy Church, DOB [birth year], MRN [id number]  Patient Location: Home Provider Location: Office  PCP:  Haywood Pao, MD  Cardiologist:  Skeet Latch, MD  Electrophysiologist:  None   Evaluation Performed:  Follow-Up Visit  Chief Complaint:  hyperlipidemia  History of Present Illness:    Ladainian Therien is a 78 y.o. male with hypertension, hyperlipidemia and prior stroke who presents for follow up.   He was initially seen 07/2016 for management of hyperlipidemia. He has previously failed atorvastatin, pravastatin and rosuvastatin .  He either failed to reach target or developed side effects.  At his last appointment he was doing well.  He was intermittently taking the Praluent due to cost.  He saw our pharmacist and was enrolled in an assistance program.  LDL was last checked 07/2017 and was 57.  At his last appointment his blood pressure was above goal.  However he reported that it has been well-controlled at home.  At his last appointment Mr. [place] switched from losartan to lisinopril due to national contamination.  Since then he has been well from a heart standpoint.  In the last year he has struggled with GI issues.  He has experienced a lot of abdominal pain and diarrhea.  He followed up with his GI  doctor and had an endoscopy that was unrevealing.  He started Miralax daily which helped somewhat.  He stopped eating red meat and his symptoms improved.  He later discovered that he had alpha gal.  Since cutting out dairy products his symptoms ar much better.  During this time he lost 20lb and is now a strict vegetarian.  His BP has been controlled.  The patient does not have symptoms concerning for COVID-19 infection (fever, chills, cough, or new shortness of breath).    Past Medical History:  Diagnosis Date  . 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 High cholesterol   . Hypertension   . Prostate neoplasm    Past Surgical History:  Procedure Laterality Date  . TRANSURETHRAL RESECTION OF PROSTATE  10/15/2011   Procedure: TRANSURETHRAL RESECTION OF THE PROSTATE (TURP);  Surgeon: Fredricka Bonine, MD;  Location: [facility];  Service: Urology;  Laterality: N/A;  CYSTO AND GYRUS NEEDED     Current Meds  Medication Sig  . Cholecalciferol (VITAMIN D PO) Take 5,000 Units by mouth daily.  . clopidogrel (PLAVIX) 75 MG tablet Take 1 tablet (75 mg total) by mouth daily.  . Evolocumab (REPATHA SURECLICK) 329 MG/ML SOAJ Inject 140 mg into the skin every 14 (fourteen) days.  . fluticasone (FLOVENT DISKUS) 50 MCG/BLIST diskus inhaler Inhale 1 puff into the lungs 2 (two) times daily.  Marland Kitchen lisinopril (PRINIVIL,ZESTRIL) 40 MG tablet TAKE 1 TABLET BY MOUTH EVERY DAY     Allergies:   Lipitor [atorvastatin]   Social  History   Tobacco Use  . Smoking status: Never Smoker  . Smokeless tobacco: Never Used  Substance Use Topics  . Alcohol use: Yes    Alcohol/week: 4.0 standard drinks    Types: 4 Glasses of wine per week  . Drug use: No     Family Hx: [name] family history includes Heart attack in his brother and mother; Heart failure in his sister; Hypertension in his father and mother; Stroke in his father.  ROS:   Please see the history of present illness.     All other  systems reviewed and are negative.   Prior CV studies:   The following studies were reviewed today:  09/08/15: Study Conclusions  - Left ventricle: The cavity size was normal. Systolic function was   normal. The estimated ejection fraction was in the range of 60%   to 65%. Wall motion was normal; there were no regional wall   motion abnormalities. Doppler parameters are consistent with   abnormal left ventricular relaxation (grade 1 diastolic   dysfunction). - Aortic valve: There was trivial regurgitation.  Impressions:  - No cardiac source of emboli was indentified.  Carotid Doppler 12/18/17: 1-39% bilateral ICA stenosis.   Labs/Other Tests and Data Reviewed:    EKG:  No ECG reviewed.  Recent Labs: 09/02/2018: ALT 13; BUN 13; Creatinine, Ser 0.81; Potassium 4.3; Sodium 141   Recent Lipid Panel Lab Results  Component Value Date/Time   CHOL 102 09/02/2018 09:12 AM   TRIG 87 09/02/2018 09:12 AM   HDL 50 09/02/2018 09:12 AM   CHOLHDL 2.0 09/02/2018 09:12 AM   LDLCALC 35 09/02/2018 09:12 AM    Wt Readings from Last 3 Encounters:  09/09/18 136 lb (61.7 kg)  08/01/17 143 lb (64.9 kg)  08/01/16 153 lb (69.4 kg)     Objective:    BP 123/68   Pulse 68   Ht 5' 7.5" (1.715 m)   Wt 136 lb (61.7 kg)   BMI 20.99 kg/m  GENERAL: Well-appearing.  No acute distress. HEENT: Pupils equal round.  Oral mucosa unremarkable NECK:  No jugular venous distention, no visible thyromegaly EXT:  No edema, no cyanosis no clubbing SKIN:  No rashes no nodules NEURO:  Speech fluent.  Cranial nerves grossly intact.  Moves all 4 extremities freely PSYCH:  Cognitively intact, oriented to person place and time   ASSESSMENT & PLAN:    # Hyperlipidemia:  LDL 35 on Repatha.  He notes a constant slight runny nose since being on Repatha but he is not bothered by this. No changes.  # Hypertension:  Blood pressure is well controlled on lisinopril.   # Prior stroke: Continue clopidogrel and  Repatha.  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  Repeat Dopplers 11/2018.   COVID-19 Education: The signs and symptoms of COVID-19 were discussed with the patient and how to seek care for testing (follow up with PCP or arrange E-visit).  The importance of social distancing was discussed today.  Time:   Today, I have spent 17 minutes with the patient with telehealth technology discussing the above problems.     Medication Adjustments/Labs and Tests Ordered: Current medicines are reviewed at length with the patient today.  Concerns regarding medicines are outlined above.   Tests Ordered: No orders of the defined types were placed in this encounter.   Medication Changes: No orders of the defined types were placed in this encounter.   Disposition:  Follow up in 1 year(s)  Signed, Skeet Latch,  MD  09/09/2018 9:52 AM    [facility]

## 2018-09-09 NOTE — Patient Instructions (Addendum)
Carotid dopplers 11/2018 F/u 1 year  Medication Instructions:  Your physician recommends that you continue on your current medications as directed. Please refer to the Current Medication list given to you today.  If you need a refill on your cardiac medications before your next appointment, please call your pharmacy.   Lab work: NONE  Testing/Procedures: Your physician has requested that you have a carotid duplex. This test is an ultrasound of the carotid arteries in your neck. It looks at blood flow through these arteries that supply the brain with blood. Allow one hour for this exam. There are no restrictions or special instructions. TO BE DONE 8/20  THE OFFICE WILL CALL YOU TO SCHEDULE   Follow-Up: At [facility], you and your health needs are our priority.  As part of our continuing mission to provide you with exceptional heart care, we have created designated Provider Care Teams.  These Care Teams include your primary Cardiologist (physician) and Advanced Practice Providers (APPs -  Physician Assistants and Nurse Practitioners) who all work together to provide you with the care you need, when you need it. You will need a follow up appointment in 12 months.  Please call our office 2 months in advance to schedule this appointment.  You may see Skeet Latch, MD or one of the following Advanced Practice Providers on your designated Care Team:   Kerin Ransom, PA-C Roby Lofts, [place] . Sande Rives, PA-C

## 2018-09-30 DIAGNOSIS — R1030 Lower abdominal pain, unspecified: Secondary | ICD-10-CM | POA: Diagnosis not present

## 2018-09-30 DIAGNOSIS — Z1211 Encounter for screening for malignant neoplasm of colon: Secondary | ICD-10-CM | POA: Diagnosis not present

## 2018-09-30 DIAGNOSIS — Z91018 Allergy to other foods: Secondary | ICD-10-CM | POA: Diagnosis not present

## 2018-09-30 DIAGNOSIS — K5900 Constipation, unspecified: Secondary | ICD-10-CM | POA: Diagnosis not present

## 2018-09-30 DIAGNOSIS — F419 Anxiety disorder, unspecified: Secondary | ICD-10-CM | POA: Diagnosis not present

## 2018-10-16 DIAGNOSIS — Z1331 Encounter for screening for depression: Secondary | ICD-10-CM | POA: Diagnosis not present

## 2018-10-16 DIAGNOSIS — K4000 Bilateral inguinal hernia, with obstruction, without gangrene, not specified as recurrent: Secondary | ICD-10-CM | POA: Diagnosis not present

## 2018-10-16 DIAGNOSIS — R194 Change in bowel habit: Secondary | ICD-10-CM | POA: Diagnosis not present

## 2018-10-28 DIAGNOSIS — K4090 Unilateral inguinal hernia, without obstruction or gangrene, not specified as recurrent: Secondary | ICD-10-CM | POA: Diagnosis not present

## 2018-11-23 ENCOUNTER — Other Ambulatory Visit (HOSPITAL_COMMUNITY): Payer: Self-pay | Admitting: *Deleted

## 2018-11-23 ENCOUNTER — Other Ambulatory Visit (HOSPITAL_COMMUNITY)
Admission: RE | Admit: 2018-11-23 | Discharge: 2018-11-23 | Disposition: A | Discharge status: Home / Self Care | Payer: Medicare HMO | Source: Ambulatory Visit | Attending: General Surgery | Admitting: General Surgery

## 2018-11-23 DIAGNOSIS — Z01812 Encounter for preprocedural laboratory examination: Secondary | ICD-10-CM | POA: Diagnosis not present

## 2018-11-23 DIAGNOSIS — Z20828 Contact with and (suspected) exposure to other viral communicable diseases: Secondary | ICD-10-CM | POA: Insufficient documentation

## 2018-11-23 LAB — SARS CORONAVIRUS 2 (TAT 6-24 HRS): SARS Coronavirus 2: NEGATIVE

## 2018-11-23 NOTE — Patient Instructions (Addendum)
YOU HAD  A COVID 19 TEST ON 11-23-2018. ONCE YOUR COVID TEST IS COMPLETED, PLEASE BEGIN THE QUARANTINE INSTRUCTIONS AS OUTLINED IN YOUR HANDOUT.                Jeremy Church    Your procedure is scheduled on: 11-26-2018  Report to [facility] Main  Entrance  Report to admitting at 1215 PM   1 VISITOR IS ALLOWED TO WAIT IN WAITING ROOM  ONLY DAY OF YOUR SURGERY.    Call this number if you have problems the morning of surgery [phone]    Remember: Do not eat food :After Midnight. CLEAR LIQUIDS FROM MIDNIGHT UNTIL 815 AM. NOTHING BY MOUTH AFTER 815 AM. BRUSH YOUR TEETH MORNING OF SURGERY AND RINSE YOUR MOUTH OUT, NO CHEWING GUM CANDY OR MINTS.     CLEAR LIQUID DIET   Foods Allowed                                                                     Foods Excluded  Coffee and tea, regular and decaf                             liquids that you cannot  Plain Jell-O any favor except red or purple                                           see through such as: Fruit ices (not with fruit pulp)                                     milk, soups, orange juice  Iced Popsicles                                    All solid food Carbonated beverages, regular and diet                                    Cranberry, grape and apple juices Sports drinks like Gatorade Lightly seasoned clear broth or consume(fat free) Sugar, honey syrup  Sample Menu Breakfast                                Lunch                                     Supper Cranberry juice                    Beef broth                            Chicken broth Jell-O  Grape juice                           Apple juice Coffee or tea                        Jell-O                                      Popsicle                                                Coffee or tea                        Coffee or tea  _____________________________________________________________________     Take these  medicines the morning of surgery with A SIP OF WATER: flonase nasal spray                               You may not have any metal on your body including hair pins and              piercings  Do not wear jewelry, make-up, lotions, powders or perfumes, deodorant                     Men may shave face and neck.   Do not bring valuables to the hospital. Jeremy Church.  Contacts, dentures or bridgework may not be worn into surgery.  Leave suitcase in the car. After surgery it may be brought to your room.     Patients discharged the day of surgery will not be allowed to drive home. IF YOU ARE HAVING SURGERY AND GOING HOME THE SAME DAY, YOU MUST HAVE AN ADULT TO DRIVE YOU HOME AND BE WITH YOU FOR 24 HOURS. YOU MAY GO HOME BY TAXI OR UBER OR ORTHERWISE, BUT AN ADULT MUST ACCOMPANY YOU HOME AND STAY WITH YOU FOR 24 HOURS.  Name and phone number of your driver:wife Jeremy Church [phone]              Please read over the following fact sheets you were given: _____________________________________________________________________             [facility] - Preparing for Surgery Before surgery, you can play an important role.  Because skin is not sterile, your skin needs to be as free of germs as possible.  You can reduce the number of germs on your skin by washing with CHG (chlorahexidine gluconate) soap before surgery.  CHG is an antiseptic cleaner which kills germs and bonds with the skin to continue killing germs even after washing. Please DO NOT use if you have an allergy to CHG or antibacterial soaps.  If your skin becomes reddened/irritated stop using the CHG and inform your nurse when you arrive at Short Stay. Do not shave (including legs and underarms) for at least 48 hours prior to the first CHG shower.  You may shave your face/neck. Please follow these instructions carefully:  1.  Shower with CHG Soap the  night before surgery and the  morning of  Surgery.  2.  If you choose to wash your hair, wash your hair first as usual with your  normal  shampoo.  3.  After you shampoo, rinse your hair and body thoroughly to remove the  shampoo.                           4.  Use CHG as you would any other liquid soap.  You can apply chg directly  to the skin and wash                       Gently with a scrungie or clean washcloth.  5.  Apply the CHG Soap to your body ONLY FROM THE NECK DOWN.   Do not use on face/ open                           Wound or open sores. Avoid contact with eyes, ears mouth and genitals (private parts).                       Wash face,  Genitals (private parts) with your normal soap.             6.  Wash thoroughly, paying special attention to the area where your surgery  will be performed.  7.  Thoroughly rinse your body with warm water from the neck down.  8.  DO NOT shower/wash with your normal soap after using and rinsing off  the CHG Soap.                9.  Pat yourself dry with a clean towel.            10.  Wear clean pajamas.            11.  Place clean sheets on your bed the night of your first shower and do not  sleep with pets. Day of Surgery : Do not apply any lotions/deodorants the morning of surgery.  Please wear clean clothes to the hospital/surgery center.  FAILURE TO FOLLOW THESE INSTRUCTIONS MAY RESULT IN THE CANCELLATION OF YOUR SURGERY PATIENT SIGNATURE_________________________________  NURSE SIGNATURE__________________________________  ________________________________________________________________________

## 2018-11-24 ENCOUNTER — Encounter (HOSPITAL_COMMUNITY): Payer: Self-pay

## 2018-11-24 ENCOUNTER — Encounter (HOSPITAL_COMMUNITY)
Admission: RE | Admit: 2018-11-24 | Discharge: 2018-11-24 | Disposition: A | Discharge status: Home / Self Care | Payer: Medicare HMO | Source: Ambulatory Visit | Attending: General Surgery | Admitting: General Surgery

## 2018-11-24 ENCOUNTER — Other Ambulatory Visit: Payer: Self-pay

## 2018-11-24 DIAGNOSIS — Z7902 Long term (current) use of antithrombotics/antiplatelets: Secondary | ICD-10-CM | POA: Diagnosis not present

## 2018-11-24 DIAGNOSIS — E7800 Pure hypercholesterolemia, unspecified: Secondary | ICD-10-CM | POA: Diagnosis not present

## 2018-11-24 DIAGNOSIS — Z8673 Personal history of transient ischemic attack (TIA), and cerebral infarction without residual deficits: Secondary | ICD-10-CM | POA: Diagnosis not present

## 2018-11-24 DIAGNOSIS — Z79899 Other long term (current) drug therapy: Secondary | ICD-10-CM | POA: Diagnosis not present

## 2018-11-24 DIAGNOSIS — I10 Essential (primary) hypertension: Secondary | ICD-10-CM | POA: Diagnosis not present

## 2018-11-24 DIAGNOSIS — K4090 Unilateral inguinal hernia, without obstruction or gangrene, not specified as recurrent: Secondary | ICD-10-CM | POA: Diagnosis not present

## 2018-11-24 HISTORY — DX: Unilateral inguinal hernia, without obstruction or gangrene, not specified as recurrent: K40.90

## 2018-11-24 LAB — BASIC METABOLIC PANEL
Anion gap: 7 (ref 5–15)
BUN: 11 mg/dL (ref 8–23)
CO2: 29 mmol/L (ref 22–32)
Calcium: 9.3 mg/dL (ref 8.9–10.3)
Chloride: 104 mmol/L (ref 98–111)
Creatinine, Ser: 0.89 mg/dL (ref 0.61–1.24)
GFR calc Af Amer: >60 mL/min (ref >60)
GFR calc non Af Amer: >60 mL/min (ref >60)
Glucose, Bld: 103 mg/dL — ABNORMAL HIGH (ref 70–99)
Potassium: 4.4 mmol/L (ref 3.5–5.1)
Sodium: 140 mmol/L (ref 135–145)

## 2018-11-24 LAB — CBC
HCT: 51.5 % (ref 39.0–52.0)
Hemoglobin: 17.4 g/dL — ABNORMAL HIGH (ref 13.0–17.0)
MCH: 31.1 pg (ref 26.0–34.0)
MCHC: 33.8 g/dL (ref 30.0–36.0)
MCV: 92 fL (ref 80.0–100.0)
Platelets: 152 10*3/uL (ref 150–400)
RBC: 5.6 MIL/uL (ref 4.22–5.81)
RDW: 13.6 % (ref 11.5–15.5)
WBC: 5.4 10*3/uL (ref 4.0–10.5)
nRBC: 0 % (ref 0.0–0.2)

## 2018-11-24 NOTE — Progress Notes (Signed)
PCP:dr tisovec  CARDIOLOGIST:none  INFO IN Epic:cbc, bmet,  ekg 11-24-18  INFO ON CHART:  BLOOD THINNERS AND LAST DOSES:plavix managed by dr Osborne Casco, patient stopped plavix 11-21-2018 on own, no instruction given ____________________________________  PATIENT SYMPTOMS AT TIME OF PREOP:none

## 2018-11-26 ENCOUNTER — Ambulatory Visit (HOSPITAL_COMMUNITY): Payer: Medicare HMO | Admitting: Anesthesiology

## 2018-11-26 ENCOUNTER — Ambulatory Visit (HOSPITAL_COMMUNITY)
Admission: RE | Admit: 2018-11-26 | Discharge: 2018-11-26 | Disposition: A | Discharge status: Home / Self Care | Payer: Medicare HMO | Source: Other Acute Inpatient Hospital | Attending: General Surgery | Admitting: General Surgery

## 2018-11-26 ENCOUNTER — Encounter (HOSPITAL_COMMUNITY)
Admission: RE | Disposition: A | Discharge status: Home / Self Care | Payer: Self-pay | Source: Other Acute Inpatient Hospital | Attending: General Surgery

## 2018-11-26 ENCOUNTER — Telehealth (HOSPITAL_COMMUNITY): Payer: Self-pay | Admitting: *Deleted

## 2018-11-26 ENCOUNTER — Other Ambulatory Visit: Payer: Self-pay

## 2018-11-26 ENCOUNTER — Encounter (HOSPITAL_COMMUNITY): Payer: Self-pay | Admitting: Emergency Medicine

## 2018-11-26 ENCOUNTER — Ambulatory Visit (HOSPITAL_COMMUNITY): Payer: Medicare HMO | Admitting: Physician Assistant

## 2018-11-26 DIAGNOSIS — I10 Essential (primary) hypertension: Secondary | ICD-10-CM | POA: Diagnosis not present

## 2018-11-26 DIAGNOSIS — Z7902 Long term (current) use of antithrombotics/antiplatelets: Secondary | ICD-10-CM | POA: Diagnosis not present

## 2018-11-26 DIAGNOSIS — E785 Hyperlipidemia, unspecified: Secondary | ICD-10-CM | POA: Diagnosis not present

## 2018-11-26 DIAGNOSIS — Z8673 Personal history of transient ischemic attack (TIA), and cerebral infarction without residual deficits: Secondary | ICD-10-CM | POA: Diagnosis not present

## 2018-11-26 DIAGNOSIS — Z79899 Other long term (current) drug therapy: Secondary | ICD-10-CM | POA: Diagnosis not present

## 2018-11-26 DIAGNOSIS — E7800 Pure hypercholesterolemia, unspecified: Secondary | ICD-10-CM | POA: Diagnosis not present

## 2018-11-26 DIAGNOSIS — K4090 Unilateral inguinal hernia, without obstruction or gangrene, not specified as recurrent: Secondary | ICD-10-CM | POA: Diagnosis not present

## 2018-11-26 DIAGNOSIS — I63112 Cerebral infarction due to embolism of left vertebral artery: Secondary | ICD-10-CM | POA: Diagnosis not present

## 2018-11-26 HISTORY — PX: INGUINAL HERNIA REPAIR: SHX194

## 2018-11-26 SURGERY — REPAIR, HERNIA, INGUINAL, ADULT
Anesthesia: General | Laterality: Left

## 2018-11-26 MED ORDER — LACTATED RINGERS IV SOLN
INTRAVENOUS | Status: DC
  Administered 2018-11-26 (×2): via INTRAVENOUS

## 2018-11-26 MED ORDER — FENTANYL CITRATE (PF) 250 MCG/5ML IJ SOLN
INTRAMUSCULAR | Status: AC
  Filled 2018-11-26: qty 5

## 2018-11-26 MED ORDER — PROMETHAZINE HCL 25 MG/ML IJ SOLN: 6.2500 mg | INTRAMUSCULAR | Status: DC | PRN

## 2018-11-26 MED ORDER — FENTANYL CITRATE (PF) 100 MCG/2ML IJ SOLN
50.0000 ug | INTRAMUSCULAR | Status: DC
  Administered 2018-11-26: 100 ug via INTRAVENOUS
  Filled 2018-11-26: qty 2

## 2018-11-26 MED ORDER — ONDANSETRON HCL 4 MG/2ML IJ SOLN
INTRAMUSCULAR | Status: AC
  Filled 2018-11-26: qty 2

## 2018-11-26 MED ORDER — BUPIVACAINE HCL (PF) 0.5 % IJ SOLN
INTRAMUSCULAR | Status: AC
  Filled 2018-11-26: qty 30

## 2018-11-26 MED ORDER — CEFAZOLIN SODIUM-DEXTROSE 2-4 GM/100ML-% IV SOLN
2.0000 g | INTRAVENOUS | Status: AC
  Administered 2018-11-26: 2 g via INTRAVENOUS
  Filled 2018-11-26: qty 100

## 2018-11-26 MED ORDER — DIPHENHYDRAMINE HCL 50 MG/ML IJ SOLN
INTRAMUSCULAR | Status: DC | PRN
  Administered 2018-11-26: 12.5 mg via INTRAVENOUS

## 2018-11-26 MED ORDER — PROPOFOL 10 MG/ML IV BOLUS
INTRAVENOUS | Status: DC | PRN
  Administered 2018-11-26: 170 mg via INTRAVENOUS

## 2018-11-26 MED ORDER — ACETAMINOPHEN 500 MG PO TABS
1000.0000 mg | ORAL_TABLET | ORAL | Status: AC
  Administered 2018-11-26: 1000 mg via ORAL
  Filled 2018-11-26: qty 2

## 2018-11-26 MED ORDER — 0.9 % SODIUM CHLORIDE (POUR BTL) OPTIME
TOPICAL | Status: DC | PRN
  Administered 2018-11-26: 1000 mL

## 2018-11-26 MED ORDER — ROCURONIUM BROMIDE 10 MG/ML (PF) SYRINGE
PREFILLED_SYRINGE | INTRAVENOUS | Status: AC
  Filled 2018-11-26: qty 10

## 2018-11-26 MED ORDER — BUPIVACAINE HCL 0.5 % IJ SOLN
INTRAMUSCULAR | Status: DC | PRN
  Administered 2018-11-26: 20 mL

## 2018-11-26 MED ORDER — ROCURONIUM BROMIDE 50 MG/5ML IV SOSY
PREFILLED_SYRINGE | INTRAVENOUS | Status: DC | PRN
  Administered 2018-11-26: 40 mg via INTRAVENOUS

## 2018-11-26 MED ORDER — BUPIVACAINE LIPOSOME 1.3 % IJ SUSP
20.0000 mL | Freq: Once | INTRAMUSCULAR | Status: DC
  Filled 2018-11-26: qty 20

## 2018-11-26 MED ORDER — CHLORHEXIDINE GLUCONATE CLOTH 2 % EX PADS: 6.0000 | MEDICATED_PAD | Freq: Once | CUTANEOUS | Status: DC

## 2018-11-26 MED ORDER — OXYCODONE HCL 5 MG PO TABS: 5.0000 mg | ORAL_TABLET | Freq: Four times a day (QID) | ORAL | 0 refills | Status: DC | PRN

## 2018-11-26 MED ORDER — SUCCINYLCHOLINE CHLORIDE 200 MG/10ML IV SOSY
PREFILLED_SYRINGE | INTRAVENOUS | Status: DC | PRN
  Administered 2018-11-26: 120 mg via INTRAVENOUS

## 2018-11-26 MED ORDER — SUCCINYLCHOLINE CHLORIDE 200 MG/10ML IV SOSY
PREFILLED_SYRINGE | INTRAVENOUS | Status: AC
  Filled 2018-11-26: qty 10

## 2018-11-26 MED ORDER — SUGAMMADEX SODIUM 200 MG/2ML IV SOLN
INTRAVENOUS | Status: DC | PRN
  Administered 2018-11-26: 150 mg via INTRAVENOUS

## 2018-11-26 MED ORDER — DEXAMETHASONE SODIUM PHOSPHATE 10 MG/ML IJ SOLN
INTRAMUSCULAR | Status: DC | PRN
  Administered 2018-11-26: 5 mg via INTRAVENOUS

## 2018-11-26 MED ORDER — DEXAMETHASONE SODIUM PHOSPHATE 10 MG/ML IJ SOLN
INTRAMUSCULAR | Status: AC
  Filled 2018-11-26: qty 1

## 2018-11-26 MED ORDER — LIDOCAINE 2% (20 MG/ML) 5 ML SYRINGE
INTRAMUSCULAR | Status: AC
  Filled 2018-11-26: qty 5

## 2018-11-26 MED ORDER — FENTANYL CITRATE (PF) 100 MCG/2ML IJ SOLN: 25.0000 ug | INTRAMUSCULAR | Status: DC | PRN

## 2018-11-26 MED ORDER — ONDANSETRON HCL 4 MG/2ML IJ SOLN
INTRAMUSCULAR | Status: DC | PRN
  Administered 2018-11-26: 4 mg via INTRAVENOUS

## 2018-11-26 MED ORDER — FENTANYL CITRATE (PF) 100 MCG/2ML IJ SOLN
INTRAMUSCULAR | Status: DC | PRN
  Administered 2018-11-26 (×3): 50 ug via INTRAVENOUS

## 2018-11-26 MED ORDER — PROPOFOL 10 MG/ML IV BOLUS
INTRAVENOUS | Status: AC
  Filled 2018-11-26: qty 20

## 2018-11-26 MED ORDER — DIPHENHYDRAMINE HCL 50 MG/ML IJ SOLN
INTRAMUSCULAR | Status: AC
  Filled 2018-11-26: qty 1

## 2018-11-26 MED ORDER — MIDAZOLAM HCL 2 MG/2ML IJ SOLN
1.0000 mg | INTRAMUSCULAR | Status: DC
  Administered 2018-11-26: 2 mg via INTRAVENOUS
  Filled 2018-11-26: qty 2

## 2018-11-26 SURGICAL SUPPLY — 45 items
BENZOIN TINCTURE PRP APPL 2/3 (GAUZE/BANDAGES/DRESSINGS) ×2 IMPLANT
BLADE SURG 15 STRL LF DISP TIS (BLADE) ×1 IMPLANT
BLADE SURG 15 STRL SS (BLADE) ×1
CELLS DAT CNTRL 66122 CELL SVR (MISCELLANEOUS) IMPLANT
CHLORAPREP W/TINT 26 (MISCELLANEOUS) ×2 IMPLANT
COVER SURGICAL LIGHT HANDLE (MISCELLANEOUS) ×2 IMPLANT
COVER WAND RF STERILE (DRAPES) ×2 IMPLANT
DECANTER SPIKE VIAL GLASS SM (MISCELLANEOUS) ×2 IMPLANT
DERMABOND ADVANCED (GAUZE/BANDAGES/DRESSINGS) ×1
DERMABOND ADVANCED .7 DNX12 (GAUZE/BANDAGES/DRESSINGS) ×1 IMPLANT
DRAIN PENROSE 18X1/2 LTX STRL (DRAIN) ×2 IMPLANT
DRAPE LAPAROTOMY TRNSV 102X78 (DRAPES) ×2 IMPLANT
DRSG TEGADERM 4X4.75 (GAUZE/BANDAGES/DRESSINGS) IMPLANT
DRSG TELFA PLUS 4X6 ADH ISLAND (GAUZE/BANDAGES/DRESSINGS) ×2 IMPLANT
ELECT PENCIL ROCKER SW 15FT (MISCELLANEOUS) ×2 IMPLANT
ELECT REM PT RETURN 15FT ADLT (MISCELLANEOUS) ×2 IMPLANT
GAUZE SPONGE 4X4 12PLY STRL (GAUZE/BANDAGES/DRESSINGS) IMPLANT
GLOVE BIOGEL PI IND STRL 7.0 (GLOVE) ×1 IMPLANT
GLOVE BIOGEL PI INDICATOR 7.0 (GLOVE) ×1
GLOVE SURG SS PI 7.0 STRL IVOR (GLOVE) ×2 IMPLANT
GOWN STRL REUS W/TWL LRG LVL3 (GOWN DISPOSABLE) ×2 IMPLANT
GOWN STRL REUS W/TWL XL LVL3 (GOWN DISPOSABLE) ×2 IMPLANT
KIT BASIN OR (CUSTOM PROCEDURE TRAY) ×2 IMPLANT
KIT TURNOVER KIT A (KITS) IMPLANT
MESH HERNIA 3X6 (Mesh General) ×2 IMPLANT
NEEDLE HYPO 22GX1.5 SAFETY (NEEDLE) ×2 IMPLANT
PACK BASIC VI WITH GOWN DISP (CUSTOM PROCEDURE TRAY) ×2 IMPLANT
RETRACTOR WND ALEXIS 25 LRG (MISCELLANEOUS) IMPLANT
RTRCTR WOUND ALEXIS 18CM MED (MISCELLANEOUS)
RTRCTR WOUND ALEXIS 25CM LRG (MISCELLANEOUS)
SPONGE LAP 18X18 RF (DISPOSABLE) IMPLANT
SPONGE LAP 4X18 RFD (DISPOSABLE) ×2 IMPLANT
STRIP CLOSURE SKIN 1/2X4 (GAUZE/BANDAGES/DRESSINGS) IMPLANT
SUT MNCRL AB 4-0 PS2 18 (SUTURE) ×2 IMPLANT
SUT PROLENE 2 0 CT2 30 (SUTURE) ×6 IMPLANT
SUT SILK 2 0 SH CR/8 (SUTURE) IMPLANT
SUT VIC AB 2-0 CT1 27 (SUTURE) ×1
SUT VIC AB 2-0 CT1 TAPERPNT 27 (SUTURE) ×1 IMPLANT
SUT VIC AB 3-0 SH 27 (SUTURE) ×2
SUT VIC AB 3-0 SH 27XBRD (SUTURE) ×2 IMPLANT
SYR BULB IRRIGATION 50ML (SYRINGE) ×2 IMPLANT
SYR CONTROL 10ML LL (SYRINGE) ×2 IMPLANT
TOWEL OR 17X26 10 PK STRL BLUE (TOWEL DISPOSABLE) ×2 IMPLANT
TOWEL OR NON WOVEN STRL DISP B (DISPOSABLE) ×2 IMPLANT
YANKAUER SUCT BULB TIP 10FT TU (MISCELLANEOUS) ×2 IMPLANT

## 2018-11-26 NOTE — Progress Notes (Signed)
Assisted Dr. Singer with left, ultrasound guided, transabdominal plane block. Side rails up, monitors on throughout procedure. See vital signs in flow sheet. Tolerated Procedure well.  

## 2018-11-26 NOTE — Op Note (Signed)
Preop diagnosis: left inguinal hernia  Postop diagnosis: left indirect inguinal hernia  Procedure: open Left inguinal hernia repair with mesh  Surgeon: Gurney Maxin, M.D.  Asst: none  Anesthesia: Gen.   Indications for procedure: Jeremy Church is a 78 y.o. male with symptoms of pain and enlarging Left inguinal hernia(s). After discussing risks, alternatives and benefits he decided on open repair and was brought to day surgery for repair.  Description of procedure: The patient was brought into the operative suite, placed supine. Anesthesia was administered with endotracheal tube. Patient was strapped in place. The patient was prepped and draped in the usual sterile fashion.  The anterior superior iliac spine and pubic tubercle were identified on the Left side. An incision was made 1cm above the connecting line, representative of the location of the inguinal ligament. The subcutaneous tissue was bluntly dissected, scarpa's fascia was dissected away. The external abdominal oblique fascia was identified and sharply opened down to the external inguinal ring. The conjoint tendon and inguinal ligament were identified. The cord structures and sac were dissected free of the surrounding tissue in 360 degrees. A penrose drain was used to encircle the contents. The cremasteric fibers were dissected free of the contents of the cord and hernia sac. The cord structures (vessels and vas deferens) were identified and carefully dissected away from the hernia sac. The hernia sac was dissected down to the internal inguinal ring. Preperitoneal fat was identified showing appropriate dissection. The hernia was indirect and large. There were moderate adhesions to it and surrounding tissue that were dissected free. The sac was then reduced into the preperitoneal space. A 3x6 Bard mesh was then used to close the defect and reinforce the floor. The mesh was sutured to the lacunar ligament and inguinal ligament using a 2-0  prolene in running fashion. Next the superior edge of the mesh was sutured to the conjoined tendon using a 2-0 running Prolene. An additional 2-0 Prolene was used to suture the tail ends of the mesh together re-creating the deep ring. Cord structures are running in a neutral position through the mesh. Next the external abdominal oblique fascia was closed with a 2-0 Vicryl in running fashion to re-create the external inguinal ring. Scarpa's fascia was closed with 3-0 Vicryl in running fashion. Skin was closed with a 4-0 Monocryl subcuticular stitch in running fashion. Dermabond place for dressing. Patient woke from anesthesia and brought to PACU in stable condition. All counts are correct.    Findings: left indirect inguinal hernia  Specimen: none  Blood loss: 30 ml  Local anesthesia: 20 ml Marcaine mix  Complications: none  Implant: 3 x 6 bard mesh  Gurney Maxin, M.D. General, Bariatric, & Minimally Invasive Surgery [facility] 2:20 PM 11/26/2018

## 2018-11-26 NOTE — Anesthesia Preprocedure Evaluation (Addendum)
Anesthesia Evaluation  Patient identified by MRN, date of birth, ID band Patient awake    Reviewed: Allergy & Precautions, NPO status , Patient's Chart, lab work & pertinent test results  History of Anesthesia Complications Negative for: history of anesthetic complications  Airway Mallampati: II  TM Distance: >3 FB     Dental  (+) Dental Advisory Given   Pulmonary neg pulmonary ROS,    Pulmonary exam normal        Cardiovascular hypertension, Pt. on medications negative cardio ROS Normal cardiovascular exam     Neuro/Psych CVA, No Residual Symptoms negative psych ROS   GI/Hepatic negative GI ROS, Neg liver ROS,   Endo/Other  negative endocrine ROS  Renal/GU negative Renal ROS  negative genitourinary   Musculoskeletal negative musculoskeletal ROS (+)   Abdominal   Peds negative pediatric ROS (+)  Hematology negative hematology ROS (+)   Anesthesia Other Findings   Reproductive/Obstetrics negative OB ROS                            Anesthesia Physical Anesthesia Plan  ASA: III  Anesthesia Plan: General   Post-op Pain Management:  Regional for Post-op pain   Induction: Intravenous  PONV Risk Score and Plan: 3 and Ondansetron, Dexamethasone and Diphenhydramine  Airway Management Planned: LMA  Additional Equipment:   Intra-op Plan:   Post-operative Plan: Extubation in OR  Informed Consent: I have reviewed the patients History and Physical, chart, labs and discussed the procedure including the risks, benefits and alternatives for the proposed anesthesia with the patient or authorized representative who has indicated his/her understanding and acceptance.     Dental advisory given  Plan Discussed with: CRNA and Anesthesiologist  Anesthesia Plan Comments:        Anesthesia Quick Evaluation

## 2018-11-26 NOTE — Transfer of Care (Signed)
Immediate Anesthesia Transfer of Care Note  Patient: Jeremy Church  Procedure(s) Performed: LEFT OPEN INGUINAL HERNIA REPAIR WITH MESH (Left )  Patient Location: PACU  Anesthesia Type:General  Level of Consciousness: awake, alert  and oriented  Airway & Oxygen Therapy: Patient Spontanous Breathing and Patient connected to face mask oxygen  Post-op Assessment: Report given to RN and Post -op Vital signs reviewed and stable  Post vital signs: Reviewed and stable  Last Vitals:  Vitals Value Taken Time  BP    Temp    Pulse 93 11/26/18 1436  Resp    SpO2 100 % 11/26/18 1436  Vitals shown include unvalidated device data.  Last Pain:  Vitals:   11/26/18 1157  TempSrc:   PainSc: 0-No pain      Patients Stated Pain Goal: 4 (43/60/67 7034)  Complications: No apparent anesthesia complications

## 2018-11-26 NOTE — H&P (Signed)
Jeremy Church is an 78 y.o. male.   Chief Complaint: hernia HPI: 78 yo male with left groin pain and finding of hernia. Pain has persisted and he presents for repair.  Past Medical History:  Diagnosis Date  . 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 High cholesterol   . Hypertension   . Left inguinal hernia   . Prostate neoplasm   . 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 TRANSURETHRAL RESECTION OF PROSTATE  10/15/2011   Procedure: TRANSURETHRAL RESECTION OF THE PROSTATE (TURP);  Surgeon: Fredricka Bonine, MD;  Location: [facility];  Service: Urology;  Laterality: N/A;  CYSTO AND GYRUS NEEDED    Family History  Problem Relation Age of Onset  . Hypertension Mother   . Heart attack Mother   . Hypertension Father   . Stroke Father   . Heart failure Sister   . Heart attack Brother    Social History:  reports that he has never smoked. He has never used smokeless tobacco. He reports current alcohol use of about 4.0 standard drinks of alcohol per week. He reports that he does not use drugs.  Allergies:  Allergies  Allergen Reactions  . Meat [Alpha-Gal]     Sneezing, runny nose, digestive problems, intestinal cramps Any hoofed animal meat  . Lipitor [Atorvastatin] Other (See Comments)    Muscle pain and weakness    Medications Prior to Admission  Medication Sig Dispense Refill  . Cholecalciferol (VITAMIN D PO) Take 5,000 Units by mouth daily.    . clopidogrel (PLAVIX) 75 MG tablet Take 1 tablet (75 mg total) by mouth daily. 90 tablet 5  . Evolocumab (REPATHA SURECLICK) 364 MG/ML SOAJ Inject 140 mg into the skin every 14 (fourteen) days. 2 pen 12  . fluticasone (FLONASE) 50 MCG/ACT nasal spray Place 2 sprays into both nostrils daily.    Marland Kitchen lisinopril (PRINIVIL,ZESTRIL) 40 MG tablet TAKE 1 TABLET BY MOUTH EVERY DAY (Patient taking differently: Take 40 mg by mouth daily. ) 90 tablet 3    No results  found for this or any previous visit (from the past 48 hour(s)). No results found.  Review of Systems  Constitutional: Negative for chills and fever.  HENT: Negative for hearing loss.   Eyes: Negative for blurred vision and double vision.  Respiratory: Negative for cough and hemoptysis.   Cardiovascular: Negative for chest pain and palpitations.  Gastrointestinal: Negative for abdominal pain, nausea and vomiting.  Genitourinary: Negative for dysuria and urgency.  Musculoskeletal: Negative for myalgias and neck pain.  Skin: Negative for itching and rash.  Neurological: Negative for dizziness, tingling and headaches.  Endo/Heme/Allergies: Does not bruise/bleed easily.  Psychiatric/Behavioral: Negative for depression and suicidal ideas.    Blood pressure (!) 162/84, pulse 63, temperature 98 F (36.7 C), temperature source Oral, resp. rate 18, height 5\' 7"  (1.702 m), weight 62.7 kg, SpO2 100 %. Physical Exam  Vitals reviewed. Constitutional: He is oriented to person, place, and time. He appears well-developed and well-nourished.  HENT:  Head: Normocephalic and atraumatic.  Eyes: Pupils are equal, round, and reactive to light. Conjunctivae and EOM are normal.  Neck: Normal range of motion. Neck supple.  Cardiovascular: Normal rate and regular rhythm.  Respiratory: Effort normal and breath sounds normal.  GI: Soft. Bowel sounds are normal. He exhibits no distension. There is no abdominal tenderness.  Left inguinal hernia  Musculoskeletal: Normal range of motion.  Neurological:  He is alert and oriented to person, place, and time.  Skin: Skin is warm and dry.  Psychiatric: He has a normal mood and affect. His behavior is normal.     Assessment/Plan 78 yo male with left inguinal hernia -open left inguinal hernia repair with mesh -planned outpatient procedure  Mickeal Skinner, MD 11/26/2018, 12:21 PM

## 2018-11-26 NOTE — Anesthesia Postprocedure Evaluation (Signed)
Anesthesia Post Note  Patient: Jeremy Church  Procedure(s) Performed: LEFT OPEN INGUINAL HERNIA REPAIR WITH MESH (Left )     Patient location during evaluation: PACU Anesthesia Type: General Level of consciousness: sedated Pain management: pain level controlled Vital Signs Assessment: post-procedure vital signs reviewed and stable Respiratory status: spontaneous breathing and respiratory function stable Cardiovascular status: stable Postop Assessment: no apparent nausea or vomiting Anesthetic complications: no    Last Vitals:  Vitals:   11/26/18 1639 11/26/18 1645  BP: (!) 157/77   Pulse: 74 82  Resp: 16   Temp: 36.6 C   SpO2: 96% 99%    Last Pain:  Vitals:   11/26/18 1639  TempSrc:   PainSc: 0-No pain                 Zaidy Absher DANIEL

## 2018-11-26 NOTE — Anesthesia Procedure Notes (Signed)
Procedure Name: Intubation Date/Time: 11/26/2018 1:09 PM Performed by: Montel Clock, CRNA Pre-anesthesia Checklist: Patient identified, Emergency Drugs available, Suction available, Patient being monitored and Timeout performed Patient Re-evaluated:Patient Re-evaluated prior to induction Oxygen Delivery Method: Circle system utilized Preoxygenation: Pre-oxygenation with 100% oxygen Induction Type: IV induction and Rapid sequence Laryngoscope Size: Mac and 3 Grade View: Grade II Tube type: Oral Tube size: 7.5 mm Number of attempts: 1 Airway Equipment and Method: Stylet Placement Confirmation: ETT inserted through vocal cords under direct vision,  positive ETCO2 and breath sounds checked- equal and bilateral Secured at: 23 cm Tube secured with: Tape Dental Injury: Teeth and Oropharynx as per pre-operative assessment

## 2018-11-27 ENCOUNTER — Encounter (HOSPITAL_COMMUNITY): Payer: Self-pay | Admitting: General Surgery

## 2018-12-18 DIAGNOSIS — M859 Disorder of bone density and structure, unspecified: Secondary | ICD-10-CM | POA: Diagnosis not present

## 2018-12-18 DIAGNOSIS — Z125 Encounter for screening for malignant neoplasm of prostate: Secondary | ICD-10-CM | POA: Diagnosis not present

## 2018-12-18 DIAGNOSIS — E7800 Pure hypercholesterolemia, unspecified: Secondary | ICD-10-CM | POA: Diagnosis not present

## 2018-12-18 DIAGNOSIS — R972 Elevated prostate specific antigen [PSA]: Secondary | ICD-10-CM | POA: Diagnosis not present

## 2018-12-18 DIAGNOSIS — Z0000 Encounter for general adult medical examination without abnormal findings: Secondary | ICD-10-CM | POA: Diagnosis not present

## 2018-12-23 DIAGNOSIS — R82998 Other abnormal findings in urine: Secondary | ICD-10-CM | POA: Diagnosis not present

## 2018-12-25 DIAGNOSIS — I679 Cerebrovascular disease, unspecified: Secondary | ICD-10-CM | POA: Diagnosis not present

## 2018-12-25 DIAGNOSIS — E7800 Pure hypercholesterolemia, unspecified: Secondary | ICD-10-CM | POA: Diagnosis not present

## 2018-12-25 DIAGNOSIS — R194 Change in bowel habit: Secondary | ICD-10-CM | POA: Diagnosis not present

## 2018-12-25 DIAGNOSIS — I69959 Hemiplegia and hemiparesis following unspecified cerebrovascular disease affecting unspecified side: Secondary | ICD-10-CM | POA: Diagnosis not present

## 2018-12-25 DIAGNOSIS — Z1339 Encounter for screening examination for other mental health and behavioral disorders: Secondary | ICD-10-CM | POA: Diagnosis not present

## 2018-12-25 DIAGNOSIS — D692 Other nonthrombocytopenic purpura: Secondary | ICD-10-CM | POA: Diagnosis not present

## 2018-12-25 DIAGNOSIS — Z0000 Encounter for general adult medical examination without abnormal findings: Secondary | ICD-10-CM | POA: Diagnosis not present

## 2018-12-25 DIAGNOSIS — I10 Essential (primary) hypertension: Secondary | ICD-10-CM | POA: Diagnosis not present

## 2018-12-25 DIAGNOSIS — F413 Other mixed anxiety disorders: Secondary | ICD-10-CM | POA: Diagnosis not present

## 2018-12-25 DIAGNOSIS — M8580 Other specified disorders of bone density and structure, unspecified site: Secondary | ICD-10-CM | POA: Diagnosis not present

## 2018-12-29 ENCOUNTER — Ambulatory Visit (HOSPITAL_COMMUNITY)
Admission: RE | Admit: 2018-12-29 | Discharge: 2018-12-29 | Disposition: A | Discharge status: Home / Self Care | Payer: Medicare HMO | Source: Ambulatory Visit | Attending: Cardiovascular Disease | Admitting: Cardiovascular Disease

## 2018-12-29 ENCOUNTER — Other Ambulatory Visit: Payer: Self-pay

## 2018-12-29 DIAGNOSIS — I6523 Occlusion and stenosis of bilateral carotid arteries: Secondary | ICD-10-CM

## 2018-12-29 DIAGNOSIS — I63112 Cerebral infarction due to embolism of left vertebral artery: Secondary | ICD-10-CM

## 2019-01-11 DIAGNOSIS — Z1212 Encounter for screening for malignant neoplasm of rectum: Secondary | ICD-10-CM | POA: Diagnosis not present

## 2019-01-11 DIAGNOSIS — Z1211 Encounter for screening for malignant neoplasm of colon: Secondary | ICD-10-CM | POA: Diagnosis not present

## 2019-02-16 DIAGNOSIS — H1045 Other chronic allergic conjunctivitis: Secondary | ICD-10-CM | POA: Diagnosis not present

## 2019-02-16 DIAGNOSIS — H401131 Primary open-angle glaucoma, bilateral, mild stage: Secondary | ICD-10-CM | POA: Diagnosis not present

## 2019-02-16 DIAGNOSIS — H2513 Age-related nuclear cataract, bilateral: Secondary | ICD-10-CM | POA: Diagnosis not present

## 2019-02-16 DIAGNOSIS — H25013 Cortical age-related cataract, bilateral: Secondary | ICD-10-CM | POA: Diagnosis not present

## 2019-03-11 ENCOUNTER — Other Ambulatory Visit: Payer: Self-pay | Admitting: Cardiovascular Disease

## 2019-03-30 ENCOUNTER — Telehealth: Payer: Self-pay | Admitting: Pharmacist

## 2019-03-30 NOTE — Telephone Encounter (Signed)
Returned a call to pt to let them know that we are working on it and it will be processed accordingly.

## 2019-03-30 NOTE — Telephone Encounter (Signed)
Patient calling for update on Repatha PA renewal. Same insurance.

## 2019-06-11 ENCOUNTER — Other Ambulatory Visit: Payer: Self-pay | Admitting: Cardiovascular Disease

## 2019-06-15 ENCOUNTER — Telehealth: Payer: Self-pay | Admitting: Pharmacist

## 2019-06-15 NOTE — Telephone Encounter (Signed)
Called and lmomed the pt to let them know that I called the insurance company [employer] and they stated that they already had the approval on file and that the pt was trying to get the medication to soon. I instructed the pt to call back if they have issues.

## 2019-06-15 NOTE — Telephone Encounter (Signed)
Patient was unable to refill his Repatha this month. Please follow up as soon as possible with [employer]. Patient understand that we will call back in about 48 hrs.

## 2019-06-23 ENCOUNTER — Telehealth: Payer: Self-pay | Admitting: Cardiovascular Disease

## 2019-06-23 DIAGNOSIS — E7800 Pure hypercholesterolemia, unspecified: Secondary | ICD-10-CM

## 2019-06-23 DIAGNOSIS — Z5181 Encounter for therapeutic drug level monitoring: Secondary | ICD-10-CM

## 2019-06-23 NOTE — Telephone Encounter (Signed)
Left message for patient to call back  

## 2019-06-23 NOTE — Telephone Encounter (Signed)
Patient states he is requesting orders for blood work prior to his appointment scheduled for 09/21/19 with Dr. Oval Linsey.

## 2019-06-24 NOTE — Telephone Encounter (Signed)
Spoke with pt, he would like to check his cholesterol, liver and electrolytes. Lab orders mailed to the pt

## 2019-06-28 DIAGNOSIS — J302 Other seasonal allergic rhinitis: Secondary | ICD-10-CM | POA: Diagnosis not present

## 2019-06-28 DIAGNOSIS — I10 Essential (primary) hypertension: Secondary | ICD-10-CM | POA: Diagnosis not present

## 2019-06-28 DIAGNOSIS — I69959 Hemiplegia and hemiparesis following unspecified cerebrovascular disease affecting unspecified side: Secondary | ICD-10-CM | POA: Diagnosis not present

## 2019-06-28 DIAGNOSIS — E7800 Pure hypercholesterolemia, unspecified: Secondary | ICD-10-CM | POA: Diagnosis not present

## 2019-06-28 DIAGNOSIS — I679 Cerebrovascular disease, unspecified: Secondary | ICD-10-CM | POA: Diagnosis not present

## 2019-06-28 DIAGNOSIS — N400 Enlarged prostate without lower urinary tract symptoms: Secondary | ICD-10-CM | POA: Diagnosis not present

## 2019-06-28 DIAGNOSIS — R972 Elevated prostate specific antigen [PSA]: Secondary | ICD-10-CM | POA: Diagnosis not present

## 2019-06-28 DIAGNOSIS — D692 Other nonthrombocytopenic purpura: Secondary | ICD-10-CM | POA: Diagnosis not present

## 2019-06-28 DIAGNOSIS — D696 Thrombocytopenia, unspecified: Secondary | ICD-10-CM | POA: Diagnosis not present

## 2019-08-17 DIAGNOSIS — H21233 Degeneration of iris (pigmentary), bilateral: Secondary | ICD-10-CM | POA: Diagnosis not present

## 2019-08-17 DIAGNOSIS — H401131 Primary open-angle glaucoma, bilateral, mild stage: Secondary | ICD-10-CM | POA: Diagnosis not present

## 2019-08-17 DIAGNOSIS — H1045 Other chronic allergic conjunctivitis: Secondary | ICD-10-CM | POA: Diagnosis not present

## 2019-08-31 DIAGNOSIS — E7800 Pure hypercholesterolemia, unspecified: Secondary | ICD-10-CM | POA: Diagnosis not present

## 2019-08-31 DIAGNOSIS — Z5181 Encounter for therapeutic drug level monitoring: Secondary | ICD-10-CM | POA: Diagnosis not present

## 2019-08-31 LAB — COMPREHENSIVE METABOLIC PANEL
ALT: 17 IU/L (ref 0–44)
AST: 24 IU/L (ref 0–40)
Albumin/Globulin Ratio: 1.7 (ref 1.2–2.2)
Albumin: 4.4 g/dL (ref 3.7–4.7)
Alkaline Phosphatase: 126 IU/L — ABNORMAL HIGH (ref 39–117)
BUN/Creatinine Ratio: 11 (ref 10–24)
BUN: 11 mg/dL (ref 8–27)
Bilirubin Total: 0.7 mg/dL (ref 0.0–1.2)
CO2: 23 mmol/L (ref 20–29)
Calcium: 9.3 mg/dL (ref 8.6–10.2)
Chloride: 104 mmol/L (ref 96–106)
Creatinine, Ser: 0.97 mg/dL (ref 0.76–1.27)
GFR calc Af Amer: 85 mL/min/{1.73_m2} (ref >59)
GFR calc non Af Amer: 73 mL/min/{1.73_m2} (ref >59)
Globulin, Total: 2.6 g/dL (ref 1.5–4.5)
Glucose: 96 mg/dL (ref 65–99)
Potassium: 4.8 mmol/L (ref 3.5–5.2)
Sodium: 142 mmol/L (ref 134–144)
Total Protein: 7 g/dL (ref 6.0–8.5)

## 2019-08-31 LAB — LIPID PANEL
Chol/HDL Ratio: 2.2 ratio (ref 0.0–5.0)
Cholesterol, Total: 109 mg/dL (ref 100–199)
HDL: 50 mg/dL (ref >39)
LDL Chol Calc (NIH): 40 mg/dL (ref 0–99)
Triglycerides: 105 mg/dL (ref 0–149)
VLDL Cholesterol Cal: 19 mg/dL (ref 5–40)

## 2019-09-21 ENCOUNTER — Other Ambulatory Visit: Payer: Self-pay

## 2019-09-21 ENCOUNTER — Ambulatory Visit: Payer: Medicare HMO | Admitting: Cardiovascular Disease

## 2019-09-21 ENCOUNTER — Encounter: Payer: Self-pay | Admitting: Cardiovascular Disease

## 2019-09-21 VITALS — BP 114/72 | HR 59 | Ht 67.0 in | Wt 140.0 lb

## 2019-09-21 DIAGNOSIS — I63112 Cerebral infarction due to embolism of left vertebral artery: Secondary | ICD-10-CM | POA: Diagnosis not present

## 2019-09-21 DIAGNOSIS — E7800 Pure hypercholesterolemia, unspecified: Secondary | ICD-10-CM | POA: Diagnosis not present

## 2019-09-21 DIAGNOSIS — I10 Essential (primary) hypertension: Secondary | ICD-10-CM

## 2019-09-21 DIAGNOSIS — I6523 Occlusion and stenosis of bilateral carotid arteries: Secondary | ICD-10-CM | POA: Diagnosis not present

## 2019-09-21 NOTE — Progress Notes (Signed)
Cardiology Office Note    Date:  09/21/2019   ID:  Jeremy Church, DOB 1940/05/30, MRN [id number]  PCP:  Haywood Pao, MD  Cardiologist:  Skeet Latch, MD  Electrophysiologist:  None   Evaluation Performed:  Follow-Up Visit  Chief Complaint:  hyperlipidemia  History of Present Illness:    Jeremy Church is a 79 y.o. male with hypertension, hyperlipidemia and prior stroke who presents for follow up.   He was initially seen 07/2016 for management of hyperlipidemia. He has previously failed atorvastatin, pravastatin and rosuvastatin .  He either failed to reach target or developed side effects.  At his last appointment he was doing well.  He was intermittently taking the Praluent due to cost.  He saw our pharmacist and was enrolled in an assistance program.  LDL was last checked 07/2017 and was 57.  At his last appointment his blood pressure was above goal.  However he reported that it has been well-controlled at home.  Mr. Paar switched from losartan to lisinopril due to national contamination.  His BP has been well-controled. He had a tick bite two years ago.  He now has to avoid milk, cheese, and meat form any hoofed animals.  He has now been able to control his symptoms.  He gets abdominal cramps if he eats the wrong thing.   He is now essentially vegetarian.  He hasn't been getting as much exercise lately because of coronavirus.  He is also fearful of getting another tick.  He hasn't had any chest pain or pressure.  He is tolerating Repatha well.  He denies lower extremity edema, orthopnea, or PND.   Past Medical History:  Diagnosis Date  . 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 High cholesterol   . Hypertension   . Left inguinal hernia   . Prostate neoplasm   . Stroke [facility]) 2014   minor cva, no residual deficts   Past Surgical History:  Procedure Laterality Date  . INGUINAL HERNIA REPAIR Left 11/26/2018   Procedure: LEFT OPEN INGUINAL HERNIA REPAIR WITH MESH;  Surgeon:  Kinsinger, Arta Bruce, MD;  Location: [facility];  Service: General;  Laterality: Left;  . TRANSURETHRAL RESECTION OF PROSTATE  10/15/2011   Procedure: TRANSURETHRAL RESECTION OF THE PROSTATE (TURP);  Surgeon: Fredricka Bonine, MD;  Location: [facility];  Service: Urology;  Laterality: N/A;  CYSTO AND GYRUS NEEDED     Current Meds  Medication Sig  . Cholecalciferol (VITAMIN D PO) Take 5,000 Units by mouth daily.  . clopidogrel (PLAVIX) 75 MG tablet Take 1 tablet (75 mg total) by mouth daily.  . fluticasone (FLONASE) 50 MCG/ACT nasal spray Place 2 sprays into both nostrils daily.  Marland Kitchen lisinopril (ZESTRIL) 40 MG tablet TAKE 1 TABLET EVERY DAY  . oxyCODONE (OXY IR/ROXICODONE) 5 MG immediate release tablet Take 1 tablet (5 mg total) by mouth every 6 (six) hours as needed for severe pain.  Marland Kitchen REPATHA SURECLICK XX123456 MG/ML SOAJ INJECT 140 MG INTO THE SKIN EVERY 14 (FOURTEEN) DAYS.     Allergies:   Meat [alpha-gal] and Lipitor [atorvastatin]   Social History   Tobacco Use  . Smoking status: Never Smoker  . Smokeless tobacco: Never Used  Substance Use Topics  . Alcohol use: Yes    Alcohol/week: 4.0 standard drinks    Types: 4 Glasses of wine per week    Comment: 1 glass 5 days week  . Drug use: No     Family Hx: [name] family history  includes Heart attack in his brother and mother; Heart failure in his sister; Hypertension in his father and mother; Stroke in his father.  ROS:   Please see the history of present illness.     All other systems reviewed and are negative.   Prior CV studies:   The following studies were reviewed today:  09/08/15: Study Conclusions  - Left ventricle: The cavity size was normal. Systolic function was   normal. The estimated ejection fraction was in the range of 60%   to 65%. Wall motion was normal; there were no regional wall   motion abnormalities. Doppler parameters are consistent with   abnormal left ventricular relaxation  (grade 1 diastolic   dysfunction). - Aortic valve: There was trivial regurgitation.  Impressions:  - No cardiac source of emboli was indentified.  Carotid Doppler 12/18/17 and 12/2018: 1-39% bilateral ICA stenosis.   Labs/Other Tests and Data Reviewed:    EKG:  An ECG dated 09/21/19 was personally reviewed today and demonstrated:  sinus bradycardia.  Rate 59 bpm.  LA enlargement  Recent Labs: 11/24/2018: Hemoglobin 17.4; Platelets 152 08/31/2019: ALT 17; BUN 11; Creatinine, Ser 0.97; Potassium 4.8; Sodium 142   Recent Lipid Panel Lab Results  Component Value Date/Time   CHOL 109 08/31/2019 08:35 AM   TRIG 105 08/31/2019 08:35 AM   HDL 50 08/31/2019 08:35 AM   CHOLHDL 2.2 08/31/2019 08:35 AM   LDLCALC 40 08/31/2019 08:35 AM    Wt Readings from Last 3 Encounters:  09/21/19 140 lb (63.5 kg)  11/26/18 138 lb 4 oz (62.7 kg)  11/24/18 138 lb 4 oz (62.7 kg)     Objective:    VS:  BP 114/72   Pulse (!) 59   Ht 5\' 7"  (1.702 m)   Wt 140 lb (63.5 kg)   SpO2 98%   BMI 21.93 kg/m  , BMI Body mass index is 21.93 kg/m. GENERAL:  Well appearing HEENT: Pupils equal round and reactive, fundi not visualized, oral mucosa unremarkable NECK:  No jugular venous distention, waveform within normal limits, carotid upstroke brisk and symmetric, no bruits LUNGS:  Clear to auscultation bilaterally HEART:  RRR.  PMI not displaced or sustained,S1 and S2 within normal limits, no S3, no S4, no clicks, no rubs, no murmurs ABD:  Flat, positive bowel sounds normal in frequency in pitch, no bruits, no rebound, no guarding, no midline pulsatile mass, no hepatomegaly, no splenomegaly EXT:  2 plus pulses throughout, no edema, no cyanosis no clubbing SKIN:  No rashes no nodules NEURO:  Cranial nerves II through XII grossly intact, motor grossly intact throughout PSYCH:  Cognitively intact, oriented to person place and time   ASSESSMENT & PLAN:    # Hyperlipidemia:   Lipids are very well-controlled on  Repatha.  No changes.  # Hypertension:  Blood pressure remains well-controlled on lisinopril.  No changes.  # Prior stroke: Continue clopidogrel and Repatha.  # Carotid Stenosis: 1-39% stenosis 11/2017 and 12/2018.  We will repeat carotid Dopplers 12/2020.     Medication Adjustments/Labs and Tests Ordered: Current medicines are reviewed at length with the patient today.  Concerns regarding medicines are outlined above.   Tests Ordered: Orders Placed This Encounter  Procedures  . EKG 12-Lead  . VAS US CAROTID    Medication Changes: No orders of the defined types were placed in this encounter.   Disposition:  Follow up in 1 year(s)  Signed, Skeet Latch, MD  09/21/2019 12:21 PM    [facility]

## 2019-09-21 NOTE — Patient Instructions (Signed)
Medication Instructions:  Your physician recommends that you continue on your current medications as directed. Please refer to the Current Medication list given to you today.  *If you need a refill on your cardiac medications before your next appointment, please call your pharmacy*  Lab Work: NONE  Testing/Procedures: Your physician has requested that you have a carotid duplex. This test is an ultrasound of the carotid arteries in your neck. It looks at blood flow through these arteries that supply the brain with blood. Allow one hour for this exam. There are no restrictions or special instructions. TO BE DONE 12/2020  Follow-Up: At [facility], you and your health needs are our priority.  As part of our continuing mission to provide you with exceptional heart care, we have created designated Provider Care Teams.  These Care Teams include your primary Cardiologist (physician) and Advanced Practice Providers (APPs -  Physician Assistants and Nurse Practitioners) who all work together to provide you with the care you need, when you need it.  We recommend signing up for the patient portal called "MyChart".  Sign up information is provided on this After Visit Summary.  MyChart is used to connect with patients for Virtual Visits (Telemedicine).  Patients are able to view lab/test results, encounter notes, upcoming appointments, etc.  Non-urgent messages can be sent to your provider as well.   To learn more about what you can do with MyChart, go to [email or web address].    Your next appointment:   12 month(s) You will receive a reminder letter in the mail two months in advance. If you don't receive a letter, please call our office to schedule the follow-up appointment.  The format for your next appointment:   In Person  Provider:   You may see Skeet Latch, MD or one of the following Advanced Practice Providers on your designated Care Team:    Kerin Ransom, PA-C  [place],  [place]  Coletta Memos, [place]

## 2020-01-07 DIAGNOSIS — Z125 Encounter for screening for malignant neoplasm of prostate: Secondary | ICD-10-CM | POA: Diagnosis not present

## 2020-01-07 DIAGNOSIS — M859 Disorder of bone density and structure, unspecified: Secondary | ICD-10-CM | POA: Diagnosis not present

## 2020-01-07 DIAGNOSIS — E7800 Pure hypercholesterolemia, unspecified: Secondary | ICD-10-CM | POA: Diagnosis not present

## 2020-01-12 DIAGNOSIS — E7800 Pure hypercholesterolemia, unspecified: Secondary | ICD-10-CM | POA: Diagnosis not present

## 2020-01-12 DIAGNOSIS — I10 Essential (primary) hypertension: Secondary | ICD-10-CM | POA: Diagnosis not present

## 2020-01-12 DIAGNOSIS — Z0000 Encounter for general adult medical examination without abnormal findings: Secondary | ICD-10-CM | POA: Diagnosis not present

## 2020-01-12 DIAGNOSIS — I63112 Cerebral infarction due to embolism of left vertebral artery: Secondary | ICD-10-CM | POA: Diagnosis not present

## 2020-01-12 DIAGNOSIS — I69959 Hemiplegia and hemiparesis following unspecified cerebrovascular disease affecting unspecified side: Secondary | ICD-10-CM | POA: Diagnosis not present

## 2020-01-12 DIAGNOSIS — R82998 Other abnormal findings in urine: Secondary | ICD-10-CM | POA: Diagnosis not present

## 2020-01-12 DIAGNOSIS — R413 Other amnesia: Secondary | ICD-10-CM | POA: Diagnosis not present

## 2020-01-12 DIAGNOSIS — I679 Cerebrovascular disease, unspecified: Secondary | ICD-10-CM | POA: Diagnosis not present

## 2020-01-12 DIAGNOSIS — J302 Other seasonal allergic rhinitis: Secondary | ICD-10-CM | POA: Diagnosis not present

## 2020-01-12 DIAGNOSIS — M8580 Other specified disorders of bone density and structure, unspecified site: Secondary | ICD-10-CM | POA: Diagnosis not present

## 2020-01-31 DIAGNOSIS — Z1212 Encounter for screening for malignant neoplasm of rectum: Secondary | ICD-10-CM | POA: Diagnosis not present

## 2020-02-16 DIAGNOSIS — H25013 Cortical age-related cataract, bilateral: Secondary | ICD-10-CM | POA: Diagnosis not present

## 2020-02-16 DIAGNOSIS — H2513 Age-related nuclear cataract, bilateral: Secondary | ICD-10-CM | POA: Diagnosis not present

## 2020-02-16 DIAGNOSIS — H21233 Degeneration of iris (pigmentary), bilateral: Secondary | ICD-10-CM | POA: Diagnosis not present

## 2020-02-16 DIAGNOSIS — H401131 Primary open-angle glaucoma, bilateral, mild stage: Secondary | ICD-10-CM | POA: Diagnosis not present

## 2020-02-16 DIAGNOSIS — H1045 Other chronic allergic conjunctivitis: Secondary | ICD-10-CM | POA: Diagnosis not present

## 2020-03-01 ENCOUNTER — Telehealth: Payer: Self-pay | Admitting: Cardiovascular Disease

## 2020-03-01 ENCOUNTER — Other Ambulatory Visit: Payer: Self-pay | Admitting: Cardiovascular Disease

## 2020-03-01 NOTE — Telephone Encounter (Signed)
Pt c/o medication issue:  1. Name of Medication:  REPATHA SURECLICK 747 MG/ML SOAJ  2. How are you currently taking this medication (dosage and times per day)?  Injection every 14 days   3. Are you having a reaction (difficulty breathing--STAT)?  no  4. What is your medication issue?  Pt's copay for repatha has went up to over $300 until 2022 starts. He is trying to find out if we have any samples in the office - he would only need 1-2 to make it through the year. Please call/advise.

## 2020-03-01 NOTE — Telephone Encounter (Signed)
Spoke to patient he stated he would like 2 samples of Repatha until he gets out of [place].Stated 1 sample would be ok.Advised I will send message to pharmacist.

## 2020-03-01 NOTE — Telephone Encounter (Signed)
Spoke with patient.  Explained shortage of sample Repatha pens.  Offered him one dose, and asked if he could go 3 weeks between injections until January 1.  He was agreeable to this, noting that with the pens he has at home, they will last until January.

## 2020-04-18 DIAGNOSIS — Z03818 Encounter for observation for suspected exposure to other biological agents ruled out: Secondary | ICD-10-CM | POA: Diagnosis not present

## 2020-06-27 ENCOUNTER — Other Ambulatory Visit: Payer: Self-pay | Admitting: Cardiovascular Disease

## 2020-07-17 DIAGNOSIS — I10 Essential (primary) hypertension: Secondary | ICD-10-CM | POA: Diagnosis not present

## 2020-07-17 DIAGNOSIS — Z1339 Encounter for screening examination for other mental health and behavioral disorders: Secondary | ICD-10-CM | POA: Diagnosis not present

## 2020-07-17 DIAGNOSIS — J302 Other seasonal allergic rhinitis: Secondary | ICD-10-CM | POA: Diagnosis not present

## 2020-07-17 DIAGNOSIS — D692 Other nonthrombocytopenic purpura: Secondary | ICD-10-CM | POA: Diagnosis not present

## 2020-07-17 DIAGNOSIS — I679 Cerebrovascular disease, unspecified: Secondary | ICD-10-CM | POA: Diagnosis not present

## 2020-07-17 DIAGNOSIS — Z1331 Encounter for screening for depression: Secondary | ICD-10-CM | POA: Diagnosis not present

## 2020-07-17 DIAGNOSIS — I69959 Hemiplegia and hemiparesis following unspecified cerebrovascular disease affecting unspecified side: Secondary | ICD-10-CM | POA: Diagnosis not present

## 2020-07-17 DIAGNOSIS — I63112 Cerebral infarction due to embolism of left vertebral artery: Secondary | ICD-10-CM | POA: Diagnosis not present

## 2020-07-17 DIAGNOSIS — E7800 Pure hypercholesterolemia, unspecified: Secondary | ICD-10-CM | POA: Diagnosis not present

## 2020-09-25 DIAGNOSIS — H401131 Primary open-angle glaucoma, bilateral, mild stage: Secondary | ICD-10-CM | POA: Diagnosis not present

## 2020-09-25 DIAGNOSIS — H21233 Degeneration of iris (pigmentary), bilateral: Secondary | ICD-10-CM | POA: Diagnosis not present

## 2020-10-19 DIAGNOSIS — M8580 Other specified disorders of bone density and structure, unspecified site: Secondary | ICD-10-CM | POA: Diagnosis not present

## 2020-10-19 DIAGNOSIS — E7800 Pure hypercholesterolemia, unspecified: Secondary | ICD-10-CM | POA: Diagnosis not present

## 2020-10-19 DIAGNOSIS — I10 Essential (primary) hypertension: Secondary | ICD-10-CM | POA: Diagnosis not present

## 2020-11-03 ENCOUNTER — Telehealth: Payer: Self-pay | Admitting: Cardiovascular Disease

## 2020-11-03 NOTE — Telephone Encounter (Signed)
Reviewed chart, did not see any mention of needing labs Patient has physical in September and will have lipids checked then Is due for carotid duplex 12/2020, advised could just schedule at follow up

## 2020-11-03 NOTE — Telephone Encounter (Signed)
Pt is due to see Dr. Oval Linsey on 11/14/20 for his annual f/u, pt wants to know if he will need to have lab work before his office appt? Please advise pt further

## 2020-11-03 NOTE — Telephone Encounter (Signed)
This RN called patient, pt wanted to know if he had any bloodwork ordered prior to his appointment with Dr. Oval Linsey on 7/26. This RN did not see any orders placed at this time, but would forward to Dr. Blenda Mounts nurse for review. Pt also wanted to know the location of his appointment, advised pt he would go to [facility] for his appointment. All questions/concerns addressed at this time.

## 2020-11-14 ENCOUNTER — Other Ambulatory Visit: Payer: Self-pay

## 2020-11-14 ENCOUNTER — Ambulatory Visit (HOSPITAL_BASED_OUTPATIENT_CLINIC_OR_DEPARTMENT_OTHER): Payer: Medicare HMO | Admitting: Cardiovascular Disease

## 2020-11-14 ENCOUNTER — Encounter (HOSPITAL_BASED_OUTPATIENT_CLINIC_OR_DEPARTMENT_OTHER): Payer: Self-pay | Admitting: Cardiovascular Disease

## 2020-11-14 DIAGNOSIS — I63112 Cerebral infarction due to embolism of left vertebral artery: Secondary | ICD-10-CM

## 2020-11-14 DIAGNOSIS — E7800 Pure hypercholesterolemia, unspecified: Secondary | ICD-10-CM

## 2020-11-14 DIAGNOSIS — I10 Essential (primary) hypertension: Secondary | ICD-10-CM | POA: Diagnosis not present

## 2020-11-14 DIAGNOSIS — I6523 Occlusion and stenosis of bilateral carotid arteries: Secondary | ICD-10-CM

## 2020-11-14 HISTORY — DX: Occlusion and stenosis of bilateral carotid arteries: I65.23

## 2020-11-14 MED ORDER — HYDROCHLOROTHIAZIDE 12.5 MG PO TABS: 12.5000 mg | ORAL_TABLET | Freq: Every day | ORAL | 1 refills | Status: DC

## 2020-11-14 NOTE — Assessment & Plan Note (Signed)
1 to 39% ICA stenosis bilaterally, last checked 12/2018.  We will repeat carotid Dopplers.  LDL goal less than 70.  Continue Repatha.

## 2020-11-14 NOTE — Patient Instructions (Addendum)
Medication Instructions:  START HYDROCHLOROTHIAZIDE 12.5 MG DAILY   *If you need a refill on your cardiac medications before your next appointment, please call your pharmacy*  Lab Work: FASTING LP/CMET 1 WEEK  If you have labs (blood work) drawn today and your tests are completely normal, you will receive your results only by: [place] (if you have MyChart) OR A paper copy in the mail If you have any lab test that is abnormal or we need to change your treatment, we will call you to review the results.  Testing/Procedures: Your physician has requested that you have a carotid duplex. This test is an ultrasound of the carotid arteries in your neck. It looks at blood flow through these arteries that supply the brain with blood. Allow one hour for this exam. There are no restrictions or special instructions. MOVE UP FROM September   Follow-Up: At [facility], you and your health needs are our priority.  As part of our continuing mission to provide you with exceptional heart care, we have created designated Provider Care Teams.  These Care Teams include your primary Cardiologist (physician) and Advanced Practice Providers (APPs -  Physician Assistants and Nurse Practitioners) who all work together to provide you with the care you need, when you need it.  We recommend signing up for the patient portal called "MyChart".  Sign up information is provided on this After Visit Summary.  MyChart is used to connect with patients for Virtual Visits (Telemedicine).  Patients are able to view lab/test results, encounter notes, upcoming appointments, etc.  Non-urgent messages can be sent to your provider as well.   To learn more about what you can do with MyChart, go to [email or web address].    Your next appointment:   12 month(s)  The format for your next appointment:   In Person  Provider:   Skeet Latch, MD [place] NP

## 2020-11-14 NOTE — Progress Notes (Signed)
Cardiology Office Note    Date:  11/14/2020   ID:  Jeremy Church, DOB Feb 21, 1941, MRN [id number]  PCP:  Haywood Pao, MD  Cardiologist:  Skeet Latch, MD  Electrophysiologist:  None   Evaluation Performed:  Follow-Up Visit  Chief Complaint:  hyperlipidemia  History of Present Illness:    Jeremy Church is a 80 y.o. male with hypertension, hyperlipidemia and prior stroke who presents for follow up.   He was initially seen 07/2016 for management of hyperlipidemia. He has previously failed atorvastatin, pravastatin and rosuvastatin .  He either failed to reach target or developed side effects.  At his last appointment he was doing well.  He was intermittently taking the Praluent due to cost.  He saw our pharmacist and was enrolled in an assistance program.  LDL was last checked 07/2017 and was 57.  At his last appointment his blood pressure was above goal.  However he reported that it has been well-controlled at home.   Jeremy Church switched from losartan to lisinopril due to national contamination.  His BP has been well-controlled. He had a tick bite two years ago.  He now has to avoid milk, cheese, and meat form any hoofed animals.  Today, he is feeling pretty good. He is at a 3 year point concerning his Alpha-gal. He has discovered that he can now eat a slice of cheese or a few pieces of bacon without having a reaction. While walking his dog, he now wears boots and stuffs his pant legs. This has been helping him significantly against tick bites. Predominately his shortness of breath has improved. When checking his blood pressure at home, he now needs to relax at least 15 minutes prior to a reading in order to see a number closer to 130s/70s. He continues to monitor for LE edema as well. In September 2022 he will follow-up with his PCP. He denies any chest pain, palpitations, or exertional symptoms. No headaches, lightheadedness, or syncope to report. Also has no orthopnea or PND.   Past  Medical History:  Diagnosis Date   Allergy to alpha-gal 09/09/2018   Carotid stenosis, bilateral 11/14/2020   High cholesterol    Hypertension    Left inguinal hernia    Prostate neoplasm    Stroke ([place]) 2014   minor cva, no residual deficts   Past Surgical History:  Procedure Laterality Date   INGUINAL HERNIA REPAIR Left 11/26/2018   Procedure: LEFT OPEN INGUINAL HERNIA REPAIR WITH MESH;  Surgeon: Kinsinger, Arta Bruce, MD;  Location: [facility];  Service: General;  Laterality: Left;   TRANSURETHRAL RESECTION OF PROSTATE  10/15/2011   Procedure: TRANSURETHRAL RESECTION OF THE PROSTATE (TURP);  Surgeon: Fredricka Bonine, MD;  Location[facility];  Service: Urology;  Laterality: N/A;  CYSTO AND GYRUS NEEDED     Current Meds  Medication Sig   Cholecalciferol (VITAMIN D PO) Take 5,000 Units by mouth daily.   clopidogrel (PLAVIX) 75 MG tablet Take 1 tablet (75 mg total) by mouth daily.   fluticasone (FLONASE) 50 MCG/ACT nasal spray Place 2 sprays into both nostrils daily.   hydrochlorothiazide (HYDRODIURIL) 12.5 MG tablet Take 1 tablet (12.5 mg total) by mouth daily.   lisinopril (ZESTRIL) 40 MG tablet TAKE 1 TABLET EVERY DAY   REPATHA SURECLICK XX123456 MG/ML SOAJ INJECT 140 MG INTO THE SKIN EVERY 14 (FOURTEEN) DAYS.     Allergies:   Meat [alpha-gal] and Lipitor [atorvastatin]   Social History   Tobacco Use  Smoking status: Never   Smokeless tobacco: Never  Vaping Use   Vaping Use: Never used  Substance Use Topics   Alcohol use: Yes    Alcohol/week: 4.0 standard drinks    Types: 4 Glasses of wine per week    Comment: 1 glass 5 days week   Drug use: No     Family Hx: [name] family history includes Heart attack in his brother and mother; Heart failure in his sister; Hypertension in his father and mother; Stroke in his father.  ROS:   Please see the history of present illness.    (+) Shortness of breath All other systems reviewed and are  negative.   Prior CV studies:   The following studies were reviewed today:  Carotid Doppler 12/18/17 and 12/2018: 1-39% bilateral ICA stenosis.  09/08/15: Study Conclusions   - Left ventricle: The cavity size was normal. Systolic function was   normal. The estimated ejection fraction was in the range of 60%   to 65%. Wall motion was normal; there were no regional wall   motion abnormalities. Doppler parameters are consistent with   abnormal left ventricular relaxation (grade 1 diastolic   dysfunction). - Aortic valve: There was trivial regurgitation.   Impressions:   - No cardiac source of emboli was indentified.   Labs/Other Tests and Data Reviewed:    EKG:   11/14/2020: Sinus rhythm. Rate 60 bpm. 09/21/2019: sinus bradycardia. Rate 59 bpm. LA enlargement.   Recent Labs: No results found for requested labs within last 8760 hours.   Recent Lipid Panel Lab Results  Component Value Date/Time   CHOL 109 08/31/2019 08:35 AM   TRIG 105 08/31/2019 08:35 AM   HDL 50 08/31/2019 08:35 AM   CHOLHDL 2.2 08/31/2019 08:35 AM   LDLCALC 40 08/31/2019 08:35 AM    Wt Readings from Last 3 Encounters:  11/14/20 143 lb 12.8 oz (65.2 kg)  09/21/19 140 lb (63.5 kg)  11/26/18 138 lb 4 oz (62.7 kg)     Objective:    VS:  BP (!) 142/80   Pulse 60   Ht '5\' 7"'$  (1.702 m)   Wt 143 lb 12.8 oz (65.2 kg)   BMI 22.52 kg/m  , BMI Body mass index is 22.52 kg/m. GENERAL:  Well appearing HEENT: Pupils equal round and reactive, fundi not visualized, oral mucosa unremarkable NECK:  No jugular venous distention, waveform within normal limits, carotid upstroke brisk and symmetric, no bruits LUNGS:  Clear to auscultation bilaterally HEART:  RRR.  PMI not displaced or sustained,S1 and S2 within normal limits, no S3, no S4, no clicks, no rubs, no murmurs ABD:  Flat, positive bowel sounds normal in frequency in pitch, no bruits, no rebound, no guarding, no midline pulsatile mass, no hepatomegaly, no  splenomegaly EXT:  2 plus pulses throughout, no edema, no cyanosis no clubbing SKIN:  No rashes no nodules NEURO:  Cranial nerves II through XII grossly intact, motor grossly intact throughout PSYCH:  Cognitively intact, oriented to person place and time   ASSESSMENT & PLAN:   HTN (hypertension) Blood pressures not well-controlled and seems to be increasing lately.  He is going to work on getting back more active.  We will also add HCTZ 12.5 mg daily.  Check CMP in about a week.  Carotid stenosis, bilateral 1 to 39% ICA stenosis bilaterally, last checked 12/2018.  We will repeat carotid Dopplers.  LDL goal less than 70.  Continue Repatha.  Hyperlipidemia Lipids are well-controlled on Repatha.  Repeat lipids and CMP.  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  Stable and doing well.   Medication Adjustments/Labs and Tests Ordered: Current medicines are reviewed at length with the patient today.  Concerns regarding medicines are outlined above.   Tests Ordered: Orders Placed This Encounter  Procedures   Lipid panel   Comprehensive metabolic panel   EKG [id number]     Medication Changes: Meds ordered this encounter  Medications   hydrochlorothiazide (HYDRODIURIL) 12.5 MG tablet    Sig: Take 1 tablet (12.5 mg total) by mouth daily.    Dispense:  90 tablet    Refill:  1     Disposition:  Follow up with Savannaha Stonerock C. Oval Linsey, MD, [facility] in 1 year.   I,Mathew Stumpf,acting as a Education administrator for Skeet Latch, MD.,have documented all relevant documentation on the behalf of Skeet Latch, MD,as directed by  Skeet Latch, MD while in the presence of Skeet Latch, MD.  I, [place] Oval Linsey, MD have reviewed all documentation for this visit.  The documentation of the exam, diagnosis, procedures, and orders on 11/14/2020 are all accurate and complete.   Signed, Skeet Latch, MD  11/14/2020 9:39 AM    [facility]

## 2020-11-14 NOTE — Assessment & Plan Note (Signed)
He remains on clopidogrel and Repatha.  Stable and doing well.

## 2020-11-14 NOTE — Assessment & Plan Note (Signed)
Lipids are well-controlled on Repatha.  Repeat lipids and CMP.

## 2020-11-14 NOTE — Assessment & Plan Note (Signed)
Blood pressures not well-controlled and seems to be increasing lately.  He is going to work on getting back more active.  We will also add HCTZ 12.5 mg daily.  Check CMP in about a week.

## 2020-11-16 ENCOUNTER — Encounter (HOSPITAL_COMMUNITY): Payer: Medicare HMO

## 2020-11-19 DIAGNOSIS — I63112 Cerebral infarction due to embolism of left vertebral artery: Secondary | ICD-10-CM | POA: Diagnosis not present

## 2020-11-19 DIAGNOSIS — M8580 Other specified disorders of bone density and structure, unspecified site: Secondary | ICD-10-CM | POA: Diagnosis not present

## 2020-11-19 DIAGNOSIS — E7800 Pure hypercholesterolemia, unspecified: Secondary | ICD-10-CM | POA: Diagnosis not present

## 2020-11-19 DIAGNOSIS — I10 Essential (primary) hypertension: Secondary | ICD-10-CM | POA: Diagnosis not present

## 2020-11-23 ENCOUNTER — Ambulatory Visit (HOSPITAL_COMMUNITY)
Admission: RE | Admit: 2020-11-23 | Discharge: 2020-11-23 | Disposition: A | Discharge status: Home / Self Care | Payer: Medicare HMO | Source: Ambulatory Visit | Attending: Cardiology | Admitting: Cardiology

## 2020-11-23 ENCOUNTER — Other Ambulatory Visit: Payer: Self-pay

## 2020-11-23 DIAGNOSIS — E7800 Pure hypercholesterolemia, unspecified: Secondary | ICD-10-CM

## 2020-11-23 DIAGNOSIS — I6523 Occlusion and stenosis of bilateral carotid arteries: Secondary | ICD-10-CM

## 2020-11-23 DIAGNOSIS — I10 Essential (primary) hypertension: Secondary | ICD-10-CM | POA: Diagnosis not present

## 2020-11-23 LAB — COMPREHENSIVE METABOLIC PANEL
ALT: 16 IU/L (ref 0–44)
AST: 28 IU/L (ref 0–40)
Albumin/Globulin Ratio: 1.6 (ref 1.2–2.2)
Albumin: 4.4 g/dL (ref 3.6–4.6)
Alkaline Phosphatase: 111 IU/L (ref 44–121)
BUN/Creatinine Ratio: 17 (ref 10–24)
BUN: 18 mg/dL (ref 8–27)
Bilirubin Total: 1.1 mg/dL (ref 0.0–1.2)
CO2: 24 mmol/L (ref 20–29)
Calcium: 9.4 mg/dL (ref 8.6–10.2)
Chloride: 101 mmol/L (ref 96–106)
Creatinine, Ser: 1.05 mg/dL (ref 0.76–1.27)
Globulin, Total: 2.7 g/dL (ref 1.5–4.5)
Glucose: 105 mg/dL — ABNORMAL HIGH (ref 65–99)
Potassium: 4.9 mmol/L (ref 3.5–5.2)
Sodium: 140 mmol/L (ref 134–144)
Total Protein: 7.1 g/dL (ref 6.0–8.5)
eGFR: 71 mL/min/{1.73_m2} (ref >59)

## 2020-11-23 LAB — LIPID PANEL
Chol/HDL Ratio: 2.7 ratio (ref 0.0–5.0)
Cholesterol, Total: 122 mg/dL (ref 100–199)
HDL: 45 mg/dL (ref >39)
LDL Chol Calc (NIH): 55 mg/dL (ref 0–99)
Triglycerides: 123 mg/dL (ref 0–149)
VLDL Cholesterol Cal: 22 mg/dL (ref 5–40)

## 2020-12-20 DIAGNOSIS — I63112 Cerebral infarction due to embolism of left vertebral artery: Secondary | ICD-10-CM | POA: Diagnosis not present

## 2020-12-20 DIAGNOSIS — M8580 Other specified disorders of bone density and structure, unspecified site: Secondary | ICD-10-CM | POA: Diagnosis not present

## 2020-12-20 DIAGNOSIS — I10 Essential (primary) hypertension: Secondary | ICD-10-CM | POA: Diagnosis not present

## 2020-12-20 DIAGNOSIS — E7800 Pure hypercholesterolemia, unspecified: Secondary | ICD-10-CM | POA: Diagnosis not present

## 2020-12-26 ENCOUNTER — Encounter (HOSPITAL_COMMUNITY): Payer: Medicare HMO

## 2021-01-18 DIAGNOSIS — R972 Elevated prostate specific antigen [PSA]: Secondary | ICD-10-CM | POA: Diagnosis not present

## 2021-01-18 DIAGNOSIS — I10 Essential (primary) hypertension: Secondary | ICD-10-CM | POA: Diagnosis not present

## 2021-01-18 DIAGNOSIS — M859 Disorder of bone density and structure, unspecified: Secondary | ICD-10-CM | POA: Diagnosis not present

## 2021-01-18 DIAGNOSIS — M8589 Other specified disorders of bone density and structure, multiple sites: Secondary | ICD-10-CM | POA: Diagnosis not present

## 2021-01-18 DIAGNOSIS — Z125 Encounter for screening for malignant neoplasm of prostate: Secondary | ICD-10-CM | POA: Diagnosis not present

## 2021-01-18 DIAGNOSIS — E7800 Pure hypercholesterolemia, unspecified: Secondary | ICD-10-CM | POA: Diagnosis not present

## 2021-01-24 ENCOUNTER — Other Ambulatory Visit (HOSPITAL_BASED_OUTPATIENT_CLINIC_OR_DEPARTMENT_OTHER): Payer: Self-pay | Admitting: Cardiovascular Disease

## 2021-01-25 DIAGNOSIS — I63112 Cerebral infarction due to embolism of left vertebral artery: Secondary | ICD-10-CM | POA: Diagnosis not present

## 2021-01-25 DIAGNOSIS — E7800 Pure hypercholesterolemia, unspecified: Secondary | ICD-10-CM | POA: Diagnosis not present

## 2021-01-25 DIAGNOSIS — M8580 Other specified disorders of bone density and structure, unspecified site: Secondary | ICD-10-CM | POA: Diagnosis not present

## 2021-01-25 DIAGNOSIS — I10 Essential (primary) hypertension: Secondary | ICD-10-CM | POA: Diagnosis not present

## 2021-01-25 DIAGNOSIS — Z0000 Encounter for general adult medical examination without abnormal findings: Secondary | ICD-10-CM | POA: Diagnosis not present

## 2021-01-25 DIAGNOSIS — I679 Cerebrovascular disease, unspecified: Secondary | ICD-10-CM | POA: Diagnosis not present

## 2021-01-25 DIAGNOSIS — N400 Enlarged prostate without lower urinary tract symptoms: Secondary | ICD-10-CM | POA: Diagnosis not present

## 2021-01-25 DIAGNOSIS — R82998 Other abnormal findings in urine: Secondary | ICD-10-CM | POA: Diagnosis not present

## 2021-01-25 DIAGNOSIS — D692 Other nonthrombocytopenic purpura: Secondary | ICD-10-CM | POA: Diagnosis not present

## 2021-01-25 DIAGNOSIS — I69959 Hemiplegia and hemiparesis following unspecified cerebrovascular disease affecting unspecified side: Secondary | ICD-10-CM | POA: Diagnosis not present

## 2021-02-19 ENCOUNTER — Other Ambulatory Visit: Payer: Self-pay | Admitting: Cardiovascular Disease

## 2021-02-19 NOTE — Telephone Encounter (Signed)
Rx(s) sent to pharmacy electronically.  

## 2021-04-02 DIAGNOSIS — H21233 Degeneration of iris (pigmentary), bilateral: Secondary | ICD-10-CM | POA: Diagnosis not present

## 2021-04-02 DIAGNOSIS — H2513 Age-related nuclear cataract, bilateral: Secondary | ICD-10-CM | POA: Diagnosis not present

## 2021-04-02 DIAGNOSIS — H1045 Other chronic allergic conjunctivitis: Secondary | ICD-10-CM | POA: Diagnosis not present

## 2021-04-02 DIAGNOSIS — H401131 Primary open-angle glaucoma, bilateral, mild stage: Secondary | ICD-10-CM | POA: Diagnosis not present

## 2021-04-20 DIAGNOSIS — I10 Essential (primary) hypertension: Secondary | ICD-10-CM | POA: Diagnosis not present

## 2021-04-20 DIAGNOSIS — E7800 Pure hypercholesterolemia, unspecified: Secondary | ICD-10-CM | POA: Diagnosis not present

## 2021-04-20 DIAGNOSIS — I63112 Cerebral infarction due to embolism of left vertebral artery: Secondary | ICD-10-CM | POA: Diagnosis not present

## 2021-04-20 DIAGNOSIS — M8580 Other specified disorders of bone density and structure, unspecified site: Secondary | ICD-10-CM | POA: Diagnosis not present

## 2021-06-19 DIAGNOSIS — M8580 Other specified disorders of bone density and structure, unspecified site: Secondary | ICD-10-CM | POA: Diagnosis not present

## 2021-06-19 DIAGNOSIS — I63112 Cerebral infarction due to embolism of left vertebral artery: Secondary | ICD-10-CM | POA: Diagnosis not present

## 2021-06-19 DIAGNOSIS — E7800 Pure hypercholesterolemia, unspecified: Secondary | ICD-10-CM | POA: Diagnosis not present

## 2021-06-19 DIAGNOSIS — I10 Essential (primary) hypertension: Secondary | ICD-10-CM | POA: Diagnosis not present

## 2021-08-03 DIAGNOSIS — I679 Cerebrovascular disease, unspecified: Secondary | ICD-10-CM | POA: Diagnosis not present

## 2021-08-03 DIAGNOSIS — I10 Essential (primary) hypertension: Secondary | ICD-10-CM | POA: Diagnosis not present

## 2021-08-03 DIAGNOSIS — I69959 Hemiplegia and hemiparesis following unspecified cerebrovascular disease affecting unspecified side: Secondary | ICD-10-CM | POA: Diagnosis not present

## 2021-08-03 DIAGNOSIS — R972 Elevated prostate specific antigen [PSA]: Secondary | ICD-10-CM | POA: Diagnosis not present

## 2021-08-03 DIAGNOSIS — I63112 Cerebral infarction due to embolism of left vertebral artery: Secondary | ICD-10-CM | POA: Diagnosis not present

## 2021-08-03 DIAGNOSIS — M8580 Other specified disorders of bone density and structure, unspecified site: Secondary | ICD-10-CM | POA: Diagnosis not present

## 2021-08-03 DIAGNOSIS — E7800 Pure hypercholesterolemia, unspecified: Secondary | ICD-10-CM | POA: Diagnosis not present

## 2021-08-03 DIAGNOSIS — D692 Other nonthrombocytopenic purpura: Secondary | ICD-10-CM | POA: Diagnosis not present

## 2021-08-03 DIAGNOSIS — N400 Enlarged prostate without lower urinary tract symptoms: Secondary | ICD-10-CM | POA: Diagnosis not present

## 2021-10-05 DIAGNOSIS — W57XXXA Bitten or stung by nonvenomous insect and other nonvenomous arthropods, initial encounter: Secondary | ICD-10-CM | POA: Diagnosis not present

## 2021-10-05 DIAGNOSIS — Z23 Encounter for immunization: Secondary | ICD-10-CM | POA: Diagnosis not present

## 2021-10-05 DIAGNOSIS — R21 Rash and other nonspecific skin eruption: Secondary | ICD-10-CM | POA: Diagnosis not present

## 2021-10-19 DIAGNOSIS — I10 Essential (primary) hypertension: Secondary | ICD-10-CM | POA: Diagnosis not present

## 2021-10-19 DIAGNOSIS — E7800 Pure hypercholesterolemia, unspecified: Secondary | ICD-10-CM | POA: Diagnosis not present

## 2021-10-19 DIAGNOSIS — M8580 Other specified disorders of bone density and structure, unspecified site: Secondary | ICD-10-CM | POA: Diagnosis not present

## 2021-10-19 DIAGNOSIS — I63112 Cerebral infarction due to embolism of left vertebral artery: Secondary | ICD-10-CM | POA: Diagnosis not present

## 2021-11-16 ENCOUNTER — Other Ambulatory Visit (HOSPITAL_BASED_OUTPATIENT_CLINIC_OR_DEPARTMENT_OTHER): Payer: Self-pay | Admitting: Cardiovascular Disease

## 2021-11-16 ENCOUNTER — Telehealth (HOSPITAL_BASED_OUTPATIENT_CLINIC_OR_DEPARTMENT_OTHER): Payer: Self-pay | Admitting: Cardiovascular Disease

## 2021-11-16 NOTE — Telephone Encounter (Signed)
Called to schedule f/u with Dr. Oval Linsey. Left a v/m.

## 2021-12-19 ENCOUNTER — Encounter (HOSPITAL_BASED_OUTPATIENT_CLINIC_OR_DEPARTMENT_OTHER): Payer: Self-pay | Admitting: Family

## 2021-12-19 ENCOUNTER — Ambulatory Visit (HOSPITAL_BASED_OUTPATIENT_CLINIC_OR_DEPARTMENT_OTHER): Payer: Medicare HMO | Admitting: Family

## 2021-12-19 VITALS — BP 130/66 | HR 61 | Ht 67.0 in | Wt 143.0 lb

## 2021-12-19 DIAGNOSIS — I6523 Occlusion and stenosis of bilateral carotid arteries: Secondary | ICD-10-CM | POA: Diagnosis not present

## 2021-12-19 DIAGNOSIS — Z8673 Personal history of transient ischemic attack (TIA), and cerebral infarction without residual deficits: Secondary | ICD-10-CM | POA: Diagnosis not present

## 2021-12-19 DIAGNOSIS — I25118 Atherosclerotic heart disease of native coronary artery with other forms of angina pectoris: Secondary | ICD-10-CM

## 2021-12-19 DIAGNOSIS — E785 Hyperlipidemia, unspecified: Secondary | ICD-10-CM

## 2021-12-19 MED ORDER — LISINOPRIL 40 MG PO TABS: 40.0000 mg | ORAL_TABLET | Freq: Every day | ORAL | 3 refills | Status: DC

## 2021-12-19 MED ORDER — HYDROCHLOROTHIAZIDE 12.5 MG PO TABS: 12.5000 mg | ORAL_TABLET | Freq: Every day | ORAL | 3 refills | Status: DC

## 2021-12-19 NOTE — Patient Instructions (Signed)
Medication Instructions:  Your physician recommends that you continue on your current medications as directed. Please refer to the Current Medication list given to you today.  *If you need a refill on your cardiac medications before your next appointment, please call your pharmacy*   Lab Work: None  If you have labs (blood work) drawn today and your tests are completely normal, you will receive your results only by: [place] (if you have MyChart) OR A paper copy in the mail If you have any lab test that is abnormal or we need to change your treatment, we will call you to review the results.   Testing/Procedures: Your physician has requested that you have a carotid duplex in August 2024. This test is an ultrasound of the carotid arteries in your neck. It looks at blood flow through these arteries that supply the brain with blood. Allow one hour for this exam. There are no restrictions or special instructions.    Follow-Up: At [facility], you and your health needs are our priority.  As part of our continuing mission to provide you with exceptional heart care, we have created designated Provider Care Teams.  These Care Teams include your primary Cardiologist (physician) and Advanced Practice Providers (APPs -  Physician Assistants and Nurse Practitioners) who all work together to provide you with the care you need, when you need it.  We recommend signing up for the patient portal called "MyChart".  Sign up information is provided on this After Visit Summary.  MyChart is used to connect with patients for Virtual Visits (Telemedicine).  Patients are able to view lab/test results, encounter notes, upcoming appointments, etc.  Non-urgent messages can be sent to your provider as well.   To learn more about what you can do with MyChart, go to [email or web address].    Your next appointment:   1 year(s)  The format for your next appointment:   In Person  Provider:   Skeet Latch, MD or Laurann [place], NP     Important Information About Sugar

## 2021-12-19 NOTE — Progress Notes (Unsigned)
Office Visit    Patient Name: Jeremy Church Date of Encounter: 12/19/2021  PCP:  Haywood Pao, MD   [facility]  Cardiologist:  Skeet Latch, MD  Advanced Practice Provider:  No care team member to display Electrophysiologist:  None      Chief Complaint    Jeremy Church is a 81 y.o. male presents today for hypertension follow up.    Past Medical History    Past Medical History:  Diagnosis Date   Allergy to alpha-gal 09/09/2018   Carotid stenosis, bilateral 11/14/2020   High cholesterol    Hypertension    Left inguinal hernia    Prostate neoplasm    Stroke ([place]) 2014   minor cva, no residual deficts   Past Surgical History:  Procedure Laterality Date   INGUINAL HERNIA REPAIR Left 11/26/2018   Procedure: LEFT OPEN INGUINAL HERNIA REPAIR WITH MESH;  Surgeon: Kinsinger, Arta Bruce, MD;  Location: [facility];  Service: General;  Laterality: Left;   TRANSURETHRAL RESECTION OF PROSTATE  10/15/2011   Procedure: TRANSURETHRAL RESECTION OF THE PROSTATE (TURP);  Surgeon: Fredricka Bonine, MD;  Location: [facility];  Service: Urology;  Laterality: N/A;  CYSTO AND GYRUS NEEDED    Allergies  Allergies  Allergen Reactions   Meat [Alpha-Gal]     Sneezing, runny nose, digestive problems, intestinal cramps Any hoofed animal meat   Lipitor [Atorvastatin] Other (See Comments)    Muscle pain and weakness    History of Present Illness    Jeremy Church is a 81 y.o. male with a hx of HTN, HLD, prior CVA, bilateral carotid stenosis last seen 11/14/20.  Initial evaluation 2018 for HLD previously having failed atorvastatin, pravastatin, rosuvastatin. Was started on PCSK9i. [place]arta previously transitioned to Lisinopril due to national contamination. 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 Last seen 10/2020 doing well from cardiac perspective though due to elevated BP HCTZ 12.'5mg'$  daily added.   Presents today for follow up. Shares with me that  alpha gal continues to improve and he now can tolerate some red meat. Notes over the last 6 months his BP has een more consistent. Exercising regularly with walking and light jogging with his border collie. During times where ticks are present exercise small trampoline instead. Reports no shortness of breath nor dyspnea on exertion. Reports no chest pain, pressure, or tightness. No edema, orthopnea, PND. Reports no palpitations.    EKGs/Labs/Other Studies Reviewed:   The following studies were reviewed today:   EKG:  EKG is ordered today.  The ekg ordered today demonstrates NSR 61 bpm with no acute ST/T wave changes.   Recent Labs: No results found for requested labs within last 365 days.  Recent Lipid Panel    Component Value Date/Time   CHOL 122 11/23/2020 0807   TRIG 123 11/23/2020 0807   HDL 45 11/23/2020 0807   CHOLHDL 2.7 11/23/2020 0807   LDLCALC 55 11/23/2020 0807   Home Medications   Current Meds  Medication Sig   Cholecalciferol (VITAMIN D PO) Take 5,000 Units by mouth daily.   clopidogrel (PLAVIX) 75 MG tablet Take 1 tablet (75 mg total) by mouth daily.   hydrochlorothiazide (HYDRODIURIL) 12.5 MG tablet TAKE 1 TABLET EVERY DAY   lisinopril (ZESTRIL) 40 MG tablet TAKE 1 TABLET EVERY DAY   REPATHA SURECLICK 662 MG/ML SOAJ INJECT 140 MG INTO THE SKIN EVERY 14 (FOURTEEN) DAYS.     Review of Systems      All other  systems reviewed and are otherwise negative except as noted above.  Physical Exam    VS:  BP 130/66 (BP Location: Left Arm, Patient Position: Sitting, Cuff Size: Normal)   Pulse 61   Ht '5\' 7"'$  (1.702 m)   Wt 143 lb (64.9 kg)   SpO2 97%   BMI 22.40 kg/m  , BMI Body mass index is 22.4 kg/m.  Wt Readings from Last 3 Encounters:  12/19/21 143 lb (64.9 kg)  11/14/20 143 lb 12.8 oz (65.2 kg)  09/21/19 140 lb (63.5 kg)     GEN: Well nourished, well developed, in no acute distress. HEENT: normal. Neck: Supple, no JVD, carotid bruits, or masses. Cardiac:  RRR, no murmurs, rubs, or gallops. No clubbing, cyanosis, edema.  Radials/PT 2+ and equal bilaterally.  Respiratory:  Respirations regular and unlabored, clear to auscultation bilaterally. GI: Soft, nontender, nondistended. MS: No deformity or atrophy. Skin: Warm and dry, no rash. Neuro:  Strength and sensation are intact. Psych: Normal affect.  Assessment & Plan    HTN - BP well controlled. Continue current antihypertensive regimen HCTZ 12.'5mg'$ , Lisinopril '40mg'$ .   Hx of CVA - Continue Plavix, Repatha.   HLD - 07/2021 LDL 39. Continue Repatha. Did not tolerate statin. Will reach out to our pharmacy team for assistance with cost. Single pen provided as sample in clinic today.   Carotid stenosis - 11/2020 L ICA 1-39% stenosed. Repeat duplex 11/2022 ordered today. Continue Plavix, Repatha.          Disposition: Follow up in 1 year(s) with Skeet Latch, MD or APP.  Signed, Loel Dubonnet, NP 12/19/2021, 11:30 AM [place]

## 2022-01-17 DIAGNOSIS — Z1212 Encounter for screening for malignant neoplasm of rectum: Secondary | ICD-10-CM | POA: Diagnosis not present

## 2022-01-17 DIAGNOSIS — Z1211 Encounter for screening for malignant neoplasm of colon: Secondary | ICD-10-CM | POA: Diagnosis not present

## 2022-01-23 LAB — COLOGUARD: COLOGUARD: NEGATIVE

## 2022-01-31 DIAGNOSIS — E7800 Pure hypercholesterolemia, unspecified: Secondary | ICD-10-CM | POA: Diagnosis not present

## 2022-01-31 DIAGNOSIS — I10 Essential (primary) hypertension: Secondary | ICD-10-CM | POA: Diagnosis not present

## 2022-01-31 DIAGNOSIS — Z125 Encounter for screening for malignant neoplasm of prostate: Secondary | ICD-10-CM | POA: Diagnosis not present

## 2022-01-31 DIAGNOSIS — M859 Disorder of bone density and structure, unspecified: Secondary | ICD-10-CM | POA: Diagnosis not present

## 2022-01-31 DIAGNOSIS — R7989 Other specified abnormal findings of blood chemistry: Secondary | ICD-10-CM | POA: Diagnosis not present

## 2022-01-31 DIAGNOSIS — M8580 Other specified disorders of bone density and structure, unspecified site: Secondary | ICD-10-CM | POA: Diagnosis not present

## 2022-02-01 DIAGNOSIS — R972 Elevated prostate specific antigen [PSA]: Secondary | ICD-10-CM | POA: Diagnosis not present

## 2022-02-07 DIAGNOSIS — Z0000 Encounter for general adult medical examination without abnormal findings: Secondary | ICD-10-CM | POA: Diagnosis not present

## 2022-02-07 DIAGNOSIS — Z1331 Encounter for screening for depression: Secondary | ICD-10-CM | POA: Diagnosis not present

## 2022-02-07 DIAGNOSIS — I69959 Hemiplegia and hemiparesis following unspecified cerebrovascular disease affecting unspecified side: Secondary | ICD-10-CM | POA: Diagnosis not present

## 2022-02-07 DIAGNOSIS — Z1339 Encounter for screening examination for other mental health and behavioral disorders: Secondary | ICD-10-CM | POA: Diagnosis not present

## 2022-02-07 DIAGNOSIS — R972 Elevated prostate specific antigen [PSA]: Secondary | ICD-10-CM | POA: Diagnosis not present

## 2022-02-07 DIAGNOSIS — R82998 Other abnormal findings in urine: Secondary | ICD-10-CM | POA: Diagnosis not present

## 2022-02-07 DIAGNOSIS — E7800 Pure hypercholesterolemia, unspecified: Secondary | ICD-10-CM | POA: Diagnosis not present

## 2022-02-07 DIAGNOSIS — I679 Cerebrovascular disease, unspecified: Secondary | ICD-10-CM | POA: Diagnosis not present

## 2022-02-07 DIAGNOSIS — I63112 Cerebral infarction due to embolism of left vertebral artery: Secondary | ICD-10-CM | POA: Diagnosis not present

## 2022-02-07 DIAGNOSIS — I10 Essential (primary) hypertension: Secondary | ICD-10-CM | POA: Diagnosis not present

## 2022-02-07 DIAGNOSIS — D692 Other nonthrombocytopenic purpura: Secondary | ICD-10-CM | POA: Diagnosis not present

## 2022-02-07 DIAGNOSIS — Z23 Encounter for immunization: Secondary | ICD-10-CM | POA: Diagnosis not present

## 2022-04-02 DIAGNOSIS — H401131 Primary open-angle glaucoma, bilateral, mild stage: Secondary | ICD-10-CM | POA: Diagnosis not present

## 2022-04-02 DIAGNOSIS — H4323 Crystalline deposits in vitreous body, bilateral: Secondary | ICD-10-CM | POA: Diagnosis not present

## 2022-04-02 DIAGNOSIS — H2513 Age-related nuclear cataract, bilateral: Secondary | ICD-10-CM | POA: Diagnosis not present

## 2022-04-02 DIAGNOSIS — H21233 Degeneration of iris (pigmentary), bilateral: Secondary | ICD-10-CM | POA: Diagnosis not present

## 2022-05-08 ENCOUNTER — Other Ambulatory Visit (HOSPITAL_COMMUNITY): Payer: Self-pay

## 2022-05-08 ENCOUNTER — Telehealth: Payer: Self-pay

## 2022-05-08 NOTE — Telephone Encounter (Signed)
Pharmacy Patient Advocate Encounter  Prior Authorization for REPATHA 140 MG/ML INJ has been approved.    PA# [id number] Effective dates: 05/08/22 through 11/06/22   Received notification from [facility] that prior authorization for REPATHA 140 MG/ML INJ is needed.    PA submitted on 05/08/22 Key [id number] Status is pending  Karie Soda, [place] Patient Advocate Specialist Direct Number: [phone] Fax: [phone]

## 2022-07-03 ENCOUNTER — Other Ambulatory Visit (HOSPITAL_COMMUNITY): Payer: Self-pay

## 2022-10-15 ENCOUNTER — Telehealth: Payer: Self-pay

## 2022-10-15 NOTE — Telephone Encounter (Signed)
Pharmacy Patient Advocate Encounter   Received notification from [facility] that prior authorization for REPATHA is required/requested.   PA submitted to [facility] via CoverMyMeds Key or (Medicaid) confirmation # [id number]  Status is pending

## 2022-10-22 NOTE — Telephone Encounter (Signed)
Pharmacy Patient Advocate Encounter  Prior Authorization for RPATHA 140MG /ML has been APPROVED by [facility] MEDICARE. Effective until 12.31.24

## 2022-12-02 ENCOUNTER — Encounter (HOSPITAL_BASED_OUTPATIENT_CLINIC_OR_DEPARTMENT_OTHER): Payer: Medicare Other

## 2022-12-02 ENCOUNTER — Ambulatory Visit (HOSPITAL_COMMUNITY)
Admission: RE | Admit: 2022-12-02 | Discharge: 2022-12-02 | Disposition: A | Discharge status: Home / Self Care | Payer: Medicare Other | Source: Ambulatory Visit | Attending: Cardiology | Admitting: Cardiology

## 2022-12-02 ENCOUNTER — Encounter (HOSPITAL_BASED_OUTPATIENT_CLINIC_OR_DEPARTMENT_OTHER): Payer: Self-pay

## 2022-12-02 DIAGNOSIS — I6523 Occlusion and stenosis of bilateral carotid arteries: Secondary | ICD-10-CM | POA: Diagnosis present

## 2022-12-04 ENCOUNTER — Telehealth (HOSPITAL_BASED_OUTPATIENT_CLINIC_OR_DEPARTMENT_OTHER): Payer: Self-pay | Admitting: Family

## 2022-12-04 NOTE — Telephone Encounter (Signed)
Patient has an appointment on 12/16/22 with Gillian Shields, NP---he states he needs to have lab work prior to his visit but there are no orders in EPIC.  Please advise

## 2022-12-16 ENCOUNTER — Encounter (HOSPITAL_BASED_OUTPATIENT_CLINIC_OR_DEPARTMENT_OTHER): Payer: Self-pay | Admitting: Family

## 2022-12-16 ENCOUNTER — Ambulatory Visit (HOSPITAL_BASED_OUTPATIENT_CLINIC_OR_DEPARTMENT_OTHER): Payer: Medicare Other | Admitting: Family

## 2022-12-16 VITALS — BP 116/68 | HR 59 | Ht 67.0 in | Wt 141.0 lb

## 2022-12-16 DIAGNOSIS — I6522 Occlusion and stenosis of left carotid artery: Secondary | ICD-10-CM

## 2022-12-16 DIAGNOSIS — I25118 Atherosclerotic heart disease of native coronary artery with other forms of angina pectoris: Secondary | ICD-10-CM

## 2022-12-16 DIAGNOSIS — I10 Essential (primary) hypertension: Secondary | ICD-10-CM | POA: Diagnosis not present

## 2022-12-16 DIAGNOSIS — Z8673 Personal history of transient ischemic attack (TIA), and cerebral infarction without residual deficits: Secondary | ICD-10-CM

## 2022-12-16 MED ORDER — HYDROCHLOROTHIAZIDE 12.5 MG PO TABS: 12.5000 mg | ORAL_TABLET | Freq: Every day | ORAL | 3 refills | Status: DC

## 2022-12-16 MED ORDER — LISINOPRIL 40 MG PO TABS: 40.0000 mg | ORAL_TABLET | Freq: Every day | ORAL | 3 refills | Status: DC

## 2022-12-16 NOTE — Progress Notes (Signed)
Cardiology Office Note:  .   Date:  12/16/2022  ID:  Jeremy Church, DOB 08-26-1940, MRN [id number] PCP: Gaspar Garbe, MD  [facility] Providers Cardiologist:  Chilton Si, MD    History of Present Illness: .   Jeremy Church is a 82 y.o. male  with a hx of HTN, HLD, prior CVA, bilateral carotid stenosis.   Initial evaluation 2018 for HLD previously having failed atorvastatin, pravastatin, rosuvastatin. Was started on PCSK9i. [place] previously transitioned to Lisinopril due to national contamination. 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 Last seen 10/2020 doing well from cardiac perspective though due to elevated BP HCTZ 12.5mg  daily added.    Last seen 11/2021 with alpha gal intolerance improving.   11/2022 LICA 1-39% stenosis, L vertebral artery with high resistant flow, bilateral subclavian flow disturbed.Marland Kitchen  Presents today for follow up. BP at home 108-115. 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 Reports no shortness of breath nor dyspnea on exertion. Reports no chest pain, pressure, or tightness. No edema, orthopnea, PND. Reports no palpitations.  He is exercising by walking with his border collie.   BP right arm: 124/64 BP left arm: 118/60  ROS: Please see the history of present illness.    All other systems reviewed and are negative.   Studies Reviewed: .        Cardiac Studies & Procedures       ECHOCARDIOGRAM  ECHOCARDIOGRAM COMPLETE 09/08/2015  Narrative *[facility]* [address] [place], [place] [address] [phone]  ------------------------------------------------------------------- Transthoracic Echocardiography  Patient:    Kenenna, Kooiker MR #:       [id number] Study Date: 09/08/2015 Gender:     M Age:        88 Height:     170.2 cm Weight:     67.9 kg BSA:        1.8 m^2 Pt. Status: Room:  PERFORMING   [facility], Outpatient SONOGRAPHER  [facility],  [facility] ATTENDING    Callie Fielding REFERRING    Naomie Dean B  cc:  ------------------------------------------------------------------- LV EF: 60% -   65%  ------------------------------------------------------------------- Indications:      CVA (I163.9).  ------------------------------------------------------------------- History:   Risk factors:  Hypertension. Dyslipidemia.  ------------------------------------------------------------------- Study Conclusions  - Left ventricle: The cavity size was normal. Systolic function was normal. The estimated ejection fraction was in the range of 60% to 65%. Wall motion was normal; there were no regional wall motion abnormalities. Doppler parameters are consistent with abnormal left ventricular relaxation (grade 1 diastolic dysfunction). - Aortic valve: There was trivial regurgitation.  Impressions:  - No cardiac source of emboli was indentified.  Transthoracic echocardiography.  M-mode, complete 2D, spectral Doppler, and color Doppler.  Birthdate:  Patient birthdate: 1941/02/09.  Age:  Patient is 82 yr old.  Sex:  Gender: male. BMI: 23.4 kg/m^2.  Blood pressure:     156/79  Patient status: Outpatient.  Study date:  Study date: 09/08/2015. Study time: 03:11 PM.  Location:  [facility]  -------------------------------------------------------------------  ------------------------------------------------------------------- Left ventricle:  The cavity size was normal. Systolic function was normal. The estimated ejection fraction was in the range of 60% to 65%. Wall motion was normal; there were no regional wall motion abnormalities. Doppler parameters are consistent with abnormal left ventricular relaxation (grade 1 diastolic dysfunction).  ------------------------------------------------------------------- Aortic valve:   Trileaflet; normal thickness leaflets. Mobility was not restricted.  Doppler:  Transvalvular velocity was within the normal range. There was no stenosis. There was trivial regurgitation.  ------------------------------------------------------------------- Aorta:  Aortic root: The aortic root was normal in size.  ------------------------------------------------------------------- Mitral valve:   Structurally normal valve.   Mobility was not restricted.  Doppler:  Transvalvular velocity was within the normal range. There was no evidence for stenosis. There was trivial regurgitation.  ------------------------------------------------------------------- Left atrium:  The atrium was normal in size.  ------------------------------------------------------------------- Right ventricle:  The cavity size was normal. Wall thickness was normal. Systolic function was normal.  ------------------------------------------------------------------- Pulmonic valve:   Poorly visualized.  Structurally normal valve. Cusp separation was normal.  Doppler:  Transvalvular velocity was within the normal range. There was no evidence for stenosis. There was no regurgitation.  ------------------------------------------------------------------- Tricuspid valve:   Structurally normal valve.    Doppler: Transvalvular velocity was within the normal range. There was trivial regurgitation.  ------------------------------------------------------------------- Pulmonary artery:   The main pulmonary artery was normal-sized. Systolic pressure was within the normal range.  ------------------------------------------------------------------- Right atrium:  The atrium was normal in size.  ------------------------------------------------------------------- Pericardium:  There was no pericardial effusion.  ------------------------------------------------------------------- Systemic veins: Inferior vena cava: The vessel was normal in size. The respirophasic diameter changes were in the  normal range (= 50%), consistent with normal central venous pressure. Diameter: 12.4 mm.  ------------------------------------------------------------------- Measurements  IVC                                      Value        Reference ID                                       12.4  mm     ---------  Left ventricle                           Value        Reference LV ID, ED, PLAX chordal          (L)     38    mm     43 - 52 LV ID, ES, PLAX chordal                  26    mm     23 - 38 LV fx shortening, PLAX chordal           32    %      >=29 LV PW thickness, ED                      11.8  mm     --------- IVS/LV PW ratio, ED                      0.86         <=1.3 Stroke volume, 2D                        81    ml     --------- Stroke volume/bsa, 2D                    45    ml/m^2 --------- LV ejection fraction, 1-p A4C  54    %      --------- LV end-diastolic volume, 2-p             83    ml     --------- LV end-diastolic volume/bsa, 2-p         46    ml/m^2 --------- LV e&', lateral                           8.33  cm/s   --------- LV E/e&', lateral                         5.99         --------- LV s&', lateral                           15    cm/s   --------- LV e&', medial                            6.91  cm/s   --------- LV E/e&', medial                          7.22         --------- LV e&', average                           7.62  cm/s   --------- LV E/e&', average                         6.55         ---------  Ventricular septum                       Value        Reference IVS thickness, ED                        10.2  mm     ---------  LVOT                                     Value        Reference LVOT ID, S                               23    mm     --------- LVOT area                                4.15  cm^2   --------- LVOT peak velocity, S                    88.7  cm/s   --------- LVOT mean velocity, S                    61.2  cm/s   --------- LVOT VTI, S                               19.6  cm     --------- LVOT peak gradient, S                    3     mm Hg  ---------  Aortic valve                             Value        Reference Aortic regurg pressure half-time         508   ms     ---------  Aorta                                    Value        Reference Aortic root ID, ED                       28    mm     ---------  Left atrium                              Value        Reference LA ID, A-P, ES                           28    mm     --------- LA ID/bsa, A-P                           1.56  cm/m^2 <=2.2 LA volume, ES, 1-p A4C                   64    ml     --------- LA volume/bsa, ES, 1-p A4C               35.6  ml/m^2 --------- LA volume, ES, 1-p A2C                   31    ml     --------- LA volume/bsa, ES, 1-p A2C               17.3  ml/m^2 ---------  Mitral valve                             Value        Reference Mitral E-wave peak velocity              49.9  cm/s   --------- Mitral A-wave peak velocity              71.1  cm/s   --------- Mitral deceleration time                 211   ms     150 - 230 Mitral E/A ratio, peak                   0.7          ---------  Tricuspid valve                          Value        Reference Tricuspid regurg peak velocity  527   cm/s   --------- Tricuspid peak RV-RA gradient            111   mm Hg  ---------  Right ventricle                          Value        Reference RV s&', lateral, S                        15    cm/s   ---------  Legend: (L)  and  (H)  mark values outside specified reference range.  ------------------------------------------------------------------- Prepared and Electronically Authenticated by  Donato Schultz, M.D. 2017-05-19T17:37:37             Risk Assessment/Calculations:             Physical Exam:   VS:  BP 116/68   Pulse (!) 59   Ht 5\' 7"  (1.702 m)   Wt 141 lb (64 kg)   BMI 22.08 kg/m    Wt Readings from Last 3 Encounters:  12/16/22 141 lb  (64 kg)  12/19/21 143 lb (64.9 kg)  11/14/20 143 lb 12.8 oz (65.2 kg)    GEN: Well nourished, well developed in no acute distress NECK: No JVD; No carotid bruits CARDIAC: RRR, no murmurs, rubs, gallops RESPIRATORY:  Clear to auscultation without rales, wheezing or rhonchi  ABDOMEN: Soft, non-tender, non-distended EXTREMITIES:  No edema; No deformity   ASSESSMENT AND PLAN: .    HTN - Hypotensive but asymptomatic. Continue current antihypertensive regimen HCTZ 12.5mg , Lisinopril 40mg . Discussed he could reduce Lisinopril to 20mg  and monitor BP at home if he wishes.    Hx of CVA - Continue Plavix, Repatha.    HLD - 07/2021 LDL 39. 01/2022 LDL 40. Continue Repatha. Did not tolerate statin. Will reach out to our pharmacy team for assistance with cost. Single pen provided as sample in clinic today.    Carotid stenosis - 11/2020 L ICA 1-39% stenosed 11/2022 LICA 1-39% stenosis, L vertebral artery with high resistant flow, bilateral subclavian flow disturbed. No amaurosis fugax, lightheadedness, nor dizziness. BP right arm 124/64 and left arm 118/60. As asymptomatic and BP <10 points different, no indication for further evaluation at this time.  Continue Plavix, Repatha.              Dispo: follow up in 1 year  Signed, Alver Sorrow, NP

## 2022-12-16 NOTE — Patient Instructions (Signed)
Medication Instructions:  Continue your current medications.   *If you need a refill on your cardiac medications before your next appointment, please call your pharmacy*  Follow-Up: At [facility], you and your health needs are our priority.  As part of our continuing mission to provide you with exceptional heart care, we have created designated Provider Care Teams.  These Care Teams include your primary Cardiologist (physician) and Advanced Practice Providers (APPs -  Physician Assistants and Nurse Practitioners) who all work together to provide you with the care you need, when you need it.  We recommend signing up for the patient portal called "MyChart".  Sign up information is provided on this After Visit Summary.  MyChart is used to connect with patients for Virtual Visits (Telemedicine).  Patients are able to view lab/test results, encounter notes, upcoming appointments, etc.  Non-urgent messages can be sent to your provider as well.   To learn more about what you can do with MyChart, go to [email or web address].    Your next appointment:   1 year(s)  Provider:   Chilton Si, MD or Gillian Shields, NP    Other Instructions   If you want to split your Lisinopril to a half tablet if you wish. If you do, just keep an eye on BP and let us know if more than 130/80. If it is staying <120/80 on the lower dose we could consider transition to combination tablet Lisinopril-hydrochlorothiazide 20-12.5mg  one tablet daily.

## 2023-04-28 ENCOUNTER — Telehealth: Payer: Self-pay | Admitting: Cardiovascular Disease

## 2023-04-28 ENCOUNTER — Other Ambulatory Visit (HOSPITAL_COMMUNITY): Payer: Self-pay

## 2023-04-28 ENCOUNTER — Encounter (HOSPITAL_BASED_OUTPATIENT_CLINIC_OR_DEPARTMENT_OTHER): Payer: Self-pay

## 2023-04-28 MED ORDER — REPATHA SURECLICK 140 MG/ML ~~LOC~~ SOAJ: 140.0000 mg | SUBCUTANEOUS | 3 refills | Status: DC

## 2023-04-28 NOTE — Telephone Encounter (Signed)
 Pt c/o medication issue:  1. Name of Medication: REPATHA  SURECLICK 140 MG/ML SOAJ   2. How are you currently taking this medication (dosage and times per day)?   3. Are you having a reaction (difficulty breathing--STAT)?   4. What is your medication issue? Patient is requesting a call back to discuss if this medication is due for a PA.

## 2023-04-28 NOTE — Telephone Encounter (Signed)
 Pharmacy Patient Advocate Encounter   Received notification from Pt Calls Messages that prior authorization for Repatha  140mg /ml is required/requested.   Insurance verification completed.   The patient is insured through [facility] .   Per test claim: The current 28 day co-pay is, $546.52.  No PA needed at this time. This test claim was processed through [facility]- copay amounts may vary at other pharmacies due to pharmacy/plan contracts, or as the patient moves through the different stages of their insurance plan.   Prior shara is approved until 04/21/24

## 2023-04-28 NOTE — Telephone Encounter (Signed)
 Called and spoke to patient.  Patient's concerns: - PA needed for Repatha  Nurse's recommendations: - will send PA and message pt when PA is approved.  Patient verbalized understanding.

## 2024-02-03 ENCOUNTER — Other Ambulatory Visit (HOSPITAL_BASED_OUTPATIENT_CLINIC_OR_DEPARTMENT_OTHER): Payer: Self-pay | Admitting: Family

## 2024-02-03 DIAGNOSIS — I10 Essential (primary) hypertension: Secondary | ICD-10-CM

## 2024-03-04 ENCOUNTER — Telehealth: Payer: Self-pay | Admitting: *Deleted

## 2024-03-04 NOTE — Telephone Encounter (Signed)
 Pt requesting refill for Repatha  and Prior Auth to be sent to [employer].

## 2024-03-05 ENCOUNTER — Telehealth: Payer: Self-pay | Admitting: Pharmacy Technician

## 2024-03-05 ENCOUNTER — Other Ambulatory Visit (HOSPITAL_COMMUNITY): Payer: Self-pay

## 2024-03-05 NOTE — Telephone Encounter (Signed)
 PA is good. He needs apt for refills

## 2024-03-05 NOTE — Telephone Encounter (Signed)
      LF 01/27/2024 for 84 days. Too soon.

## 2024-03-19 ENCOUNTER — Other Ambulatory Visit (HOSPITAL_BASED_OUTPATIENT_CLINIC_OR_DEPARTMENT_OTHER): Payer: Self-pay | Admitting: Cardiovascular Disease

## 2024-03-19 DIAGNOSIS — I10 Essential (primary) hypertension: Secondary | ICD-10-CM

## 2024-03-22 ENCOUNTER — Other Ambulatory Visit: Payer: Self-pay | Admitting: Pharmacist

## 2024-03-22 DIAGNOSIS — I25118 Atherosclerotic heart disease of native coronary artery with other forms of angina pectoris: Secondary | ICD-10-CM

## 2024-03-22 MED ORDER — REPATHA SURECLICK 140 MG/ML ~~LOC~~ SOAJ: 140.0000 mg | SUBCUTANEOUS | 3 refills | Status: AC

## 2024-05-06 ENCOUNTER — Other Ambulatory Visit (HOSPITAL_COMMUNITY): Payer: Self-pay | Admitting: Urology

## 2024-05-06 DIAGNOSIS — C61 Malignant neoplasm of prostate: Secondary | ICD-10-CM

## 2024-05-14 ENCOUNTER — Other Ambulatory Visit (HOSPITAL_BASED_OUTPATIENT_CLINIC_OR_DEPARTMENT_OTHER): Payer: Self-pay | Admitting: Family

## 2024-05-14 DIAGNOSIS — I10 Essential (primary) hypertension: Secondary | ICD-10-CM

## 2024-05-17 ENCOUNTER — Other Ambulatory Visit (HOSPITAL_BASED_OUTPATIENT_CLINIC_OR_DEPARTMENT_OTHER): Payer: Self-pay | Admitting: Family

## 2024-05-17 DIAGNOSIS — I10 Essential (primary) hypertension: Secondary | ICD-10-CM

## 2024-05-19 ENCOUNTER — Encounter (HOSPITAL_COMMUNITY)
Admission: RE | Admit: 2024-05-19 | Discharge: 2024-05-19 | Disposition: A | Discharge status: Home / Self Care | Source: Ambulatory Visit | Attending: Urology | Admitting: Urology

## 2024-05-19 DIAGNOSIS — C61 Malignant neoplasm of prostate: Secondary | ICD-10-CM | POA: Insufficient documentation

## 2024-05-19 MED ORDER — FLOTUFOLASTAT F 18 GALLIUM 296-5846 MBQ/ML IV SOLN
8.5000 | Freq: Once | INTRAVENOUS | Status: AC
  Administered 2024-05-19: 8.5 via INTRAVENOUS

## 2024-05-21 NOTE — Telephone Encounter (Signed)
 In accordance with refill protocols, please review and address the following requirements before this medication refill can be authorized:  Labs

## 2024-07-07 ENCOUNTER — Ambulatory Visit (HOSPITAL_BASED_OUTPATIENT_CLINIC_OR_DEPARTMENT_OTHER): Admitting: Family
